# Patient Record
Sex: Male | Born: 1960 | Race: White | Hispanic: No | Marital: Married | Smoking: Never smoker
Health system: Southern US, Community
[De-identification: ages and names within clinical notes are randomized; demographics above are authoritative.]

## PROBLEM LIST (undated history)

## (undated) DIAGNOSIS — E785 Hyperlipidemia, unspecified: Secondary | ICD-10-CM

## (undated) DIAGNOSIS — K611 Rectal abscess: Secondary | ICD-10-CM

## (undated) DIAGNOSIS — K219 Gastro-esophageal reflux disease without esophagitis: Secondary | ICD-10-CM

## (undated) DIAGNOSIS — N200 Calculus of kidney: Secondary | ICD-10-CM

## (undated) DIAGNOSIS — M542 Cervicalgia: Secondary | ICD-10-CM

## (undated) DIAGNOSIS — J302 Other seasonal allergic rhinitis: Secondary | ICD-10-CM

## (undated) DIAGNOSIS — I10 Essential (primary) hypertension: Secondary | ICD-10-CM

## (undated) HISTORY — DX: Gastro-esophageal reflux disease without esophagitis: K21.9

## (undated) HISTORY — PX: NECK SURGERY: SHX720

## (undated) HISTORY — DX: Rectal abscess: K61.1

## (undated) HISTORY — PX: LUMBAR DISC SURGERY: SHX700

## (undated) HISTORY — PX: CERVICAL SPINE SURGERY: SHX589

## (undated) HISTORY — DX: Other seasonal allergic rhinitis: J30.2

## (undated) HISTORY — DX: Hyperlipidemia, unspecified: E78.5

## (undated) HISTORY — DX: Cervicalgia: M54.2

## (undated) HISTORY — PX: NASAL SEPTUM SURGERY: SHX37

## (undated) HISTORY — DX: Calculus of kidney: N20.0

## (undated) HISTORY — PX: BACK SURGERY: SHX140

## (undated) HISTORY — DX: Essential (primary) hypertension: I10

---

## 2002-01-25 ENCOUNTER — Encounter: Admission: RE | Admit: 2002-01-25 | Discharge: 2002-02-22 | Payer: Self-pay | Admitting: Unknown Physician Specialty

## 2008-11-12 ENCOUNTER — Ambulatory Visit: Admission: RE | Admit: 2008-11-12 | Discharge: 2008-11-12 | Payer: Self-pay | Admitting: Otolaryngology

## 2009-02-23 ENCOUNTER — Encounter (INDEPENDENT_AMBULATORY_CARE_PROVIDER_SITE_OTHER): Payer: Self-pay | Admitting: Otolaryngology

## 2009-02-23 ENCOUNTER — Ambulatory Visit (HOSPITAL_COMMUNITY): Admission: RE | Admit: 2009-02-23 | Discharge: 2009-02-23 | Payer: Self-pay | Admitting: Otolaryngology

## 2009-05-17 ENCOUNTER — Encounter: Admission: RE | Admit: 2009-05-17 | Discharge: 2009-05-17 | Payer: Self-pay | Admitting: Neurosurgery

## 2009-09-11 ENCOUNTER — Encounter: Admission: RE | Admit: 2009-09-11 | Discharge: 2009-09-11 | Payer: Self-pay | Admitting: Neurosurgery

## 2009-11-05 ENCOUNTER — Ambulatory Visit (HOSPITAL_COMMUNITY): Admission: RE | Admit: 2009-11-05 | Discharge: 2009-11-05 | Payer: Self-pay | Admitting: Neurosurgery

## 2009-12-19 ENCOUNTER — Encounter: Admission: RE | Admit: 2009-12-19 | Discharge: 2009-12-19 | Payer: Self-pay | Admitting: Neurosurgery

## 2011-03-12 LAB — DIFFERENTIAL
Basophils Relative: 0 % (ref 0–1)
Eosinophils Relative: 3 % (ref 0–5)
Lymphocytes Relative: 23 % (ref 12–46)
Monocytes Absolute: 0.5 10*3/uL (ref 0.1–1.0)
Monocytes Relative: 9 % (ref 3–12)
Neutro Abs: 3.4 10*3/uL (ref 1.7–7.7)
Neutrophils Relative %: 64 % (ref 43–77)

## 2011-03-12 LAB — TYPE AND SCREEN: Antibody Screen: NEGATIVE

## 2011-03-12 LAB — CBC
MCV: 86.1 fL (ref 78.0–100.0)
Platelets: 243 10*3/uL (ref 150–400)
RBC: 4.46 MIL/uL (ref 4.22–5.81)

## 2011-03-12 LAB — BASIC METABOLIC PANEL
BUN: 11 mg/dL (ref 6–23)
CO2: 31 mEq/L (ref 19–32)
Chloride: 103 mEq/L (ref 96–112)
GFR calc Af Amer: >60 mL/min (ref >60)
GFR calc non Af Amer: >60 mL/min (ref >60)
Glucose, Bld: 89 mg/dL (ref 70–99)
Sodium: 139 mEq/L (ref 135–145)

## 2011-03-20 LAB — CBC
HCT: 38.5 % — ABNORMAL LOW (ref 39.0–52.0)
Hemoglobin: 13.5 g/dL (ref 13.0–17.0)
MCHC: 35 g/dL (ref 30.0–36.0)
MCV: 86.1 fL (ref 78.0–100.0)
Platelets: 232 10*3/uL (ref 150–400)
RBC: 4.48 MIL/uL (ref 4.22–5.81)
RDW: 13.1 % (ref 11.5–15.5)

## 2011-03-25 DIAGNOSIS — J302 Other seasonal allergic rhinitis: Secondary | ICD-10-CM

## 2011-03-25 DIAGNOSIS — E785 Hyperlipidemia, unspecified: Secondary | ICD-10-CM

## 2011-04-22 NOTE — Procedures (Signed)
NAMESABIN, GIBEAULT                ACCOUNT NO.:  [id number]   MEDICAL RECORD NO.:  [id number]          PATIENT TYPE:  OUT   LOCATION:  SLEE                          FACILITY:  [facility]   PHYSICIAN:  Kofi A. Gerilyn Pilgrim, M.D. DATE OF BIRTH:  05-11-1961   DATE OF PROCEDURE:  11/12/2008  DATE OF DISCHARGE:  11/12/2008                             SLEEP DISORDER REPORT   REFERRING PHYSICIAN:  Onalee Hua L. Annalee Genta, M.D.   HISTORY:  This is a 50 year old man who presents with fatigue and  snoring. He has been evaluated for obstructive sleep apnea syndrome.   MEDICATIONS:  1. Benadryl.  2. Simvastatin.  3. Flonase.   EPWORTH SLEEPINESS SCALE:  4.   BODY MASS INDEX:  29.   ARCHITECTURAL SUMMARY:  The total recording time is 443 minutes. Sleep  efficiency 80%. Sleep latency 19 minutes. REM latency 159 minutes. Stage  N1 10%, N2 53%, N3 21%, and REM sleep 14%.   RESPIRATORY SUMMARY:  The baseline oxygen saturation is 98%. Lower  saturation is 90%. AHI 1.   LIMB MOVEMENT SUMMARY:  PLM index 0.   ELECTROCARDIOGRAM SUMMARY:  Average heart rate 65 with no significant  dysrhythmias observed.   IMPRESSION:  Unremarkable nocturnal polysomnography.      Kofi A. Gerilyn Pilgrim, M.D.  Electronically Signed     KAD/MEDQ  D:  11/24/2008  T:  11/24/2008  Job:  [id number]

## 2011-04-22 NOTE — Op Note (Signed)
Earl Martinez, Earl Martinez                ACCOUNT NO.:  [id number]   MEDICAL RECORD NO.:  [id number]          PATIENT TYPE:  AMB   LOCATION:  SDS                          FACILITY:  [facility]   PHYSICIAN:  Onalee Hua L. Annalee Genta, M.D.DATE OF BIRTH:  [birth year]   DATE OF PROCEDURE:  DATE OF DISCHARGE:  02/23/2009                               OPERATIVE REPORT   PREOPERATIVE DIAGNOSES:  1. Deviated nasal septum with nasal airway obstruction.  2. Inferior turbinate hypertrophy.  3. Uvular hypertrophy.   POSTOPERATIVE DIAGNOSES:  1. Deviated nasal septum with nasal airway obstruction.  2. Inferior turbinate hypertrophy.  3. Uvular hypertrophy.   INDICATIONS FOR SURGERY:  1. Deviated nasal septum with nasal airway obstruction.  2. Inferior turbinate hypertrophy.  3. Uvular hypertrophy.   SURGICAL PROCEDURES:  1. Nasal septoplasty.  2. Bilateral inferior turbinate reduction.  3. Uvulectomy.   ANESTHESIA:  General endotracheal.   SURGEON:  Kinnie Scales. Annalee Genta, MD   COMPLICATIONS:  None.   BLOOD LOSS:  Less than 50 mL.   The patient transferred from the operating room to the recovery room in  stable condition.   BRIEF HISTORY:  The patient is a 50 year old white male who was referred  for evaluation of progressive symptoms of nasal airway obstruction,  nasal congestion, and possible sleep apnea.  Evaluation in the office  revealed a severely deviated septum with left septal spurring and near  complete airway obstruction, bilateral turbinate hypertrophy, and  significant thickening of the palate, uvula, and posterior tonsillar  pillars.  Given the patient's history, a sleep study was performed which  showed no evidence of obstructive sleep apnea.  The patient had mild  sleep disordered breathing, but no airway desaturations.  Given his  history and physical examination, we discussed various treatment  options.  After he had failed to respond to appropriate medical therapy,  we scheduled  him for nasal septoplasty, inferior turbinate reduction,  and uvulectomy.  The risks, benefits, and possible complications of each  of these surgical procedures were discussed in detail with the patient  who understood and concurred with our plan for surgery which is  scheduled as an outpatient under general anesthesia at [facility] OR.   SURGICAL PROCEDURE:  The patient was brought to the operating room on  February 23, 2009, and placed in supine position on the operating table.  General endotracheal anesthesia was established without difficulty.  When the patient was adequately anesthetized, his nose was injected with  a total of 9 mL of 1% lidocaine 1:100,000 dilution epinephrine which was  injected in the submucosal fashion along the nasal septum and inferior  turbinates bilaterally.  The patient's nose was then packed with Afrin-  soaked cottonoid pledgets which were left in place for approximately 10  minutes to allow for vasoconstriction and hemostasis.  The patient was  positioned and prepped and draped in a sterile fashion.  Surgical  procedure was begun by creating a right anterior hemitransfixion  incision.  This was carried through the mucosa and underlying submucosa,  and a mucoperichondrial flap was elevated from anterior  to posterior  along the patient's right-hand side.  The bony cartilaginous junction  was crossed at the midline, and the mucoperiosteal flap was elevated on  the left.  There was a large bony and cartilaginous septal spur along  the entire left nasal passageway which was mobilized.  Overlying mucosa  was preserved, and the deviated bone and cartilage were resected with a  4-mm osteotome.  Mid septal cartilage was removed.  Anterior dorsal and  columellar cartilage were not deviated and these were left intact.  Posterior bony deviation was resected with through cutting forceps.  The  mid septal cartilage was morselized and then returned to the   mucoperichondrial pocket.  The hemitransfixion incision was closed with  a 4-0 gut suture on a Keith needle, and the flaps were reapproximated  with the same stitch in a horizontal mattress in the fashion.  At the  conclusion of this portion of the procedure, nasal septal splints were  then placed after the application of Bactroban ointment and were sutured  in position with a 3-0 Ethilon suture.   Inferior turbinate reduction was then begun with bipolar cautery set at  12 watts.  Two submucosal passes were made in each inferior turbinate.  When the turbinates have been adequately cauterized, small incisions  were created in each inferior turbinate.  Overlying mucosa was elevated.  A small amount of turbinate bone was resected.  Turbinates were then  outfractured to create a more patent nasal cavity.  Nasal cavity and  nasopharynx were irrigated and suctioned.   Attention was then turned to the oral cavity and oropharynx where  uvulectomy was performed.  A Crowe-Davis mouthgag was inserted without  difficulty.  The palatal margin was then gently palpated and Bovie  electrocautery was used to create an incision along the anterior aspect  of the soft palate.  This resection included mucosa and muscular layer,  preserving the posterior mucosa for closure.  A U-shaped incision was  created along the entire palatal margin resecting an approximately one-  half centimeter cm strip of tissue including the entire uvula which was  sent to Pathology.  Posterior palatal mucosa was then advanced  anteriorly and sutured using 3-0 Vicryl suture in an interrupted  fashion.  The posterolateral aspects were advanced in order to create a  more patent nasopharynx.  There was no bleeding.  An orogastric tube was  passed.  Stomach contents were aspirated.  The patient was then awakened  from his anesthetic, he was extubated and was transferred from the  operating room to the recovery room in stable condition.   There were no  complications, and blood loss was less than 50 mL.           ______________________________  Kinnie Scales. Annalee Genta, M.D.     DLS/MEDQ  D:  16/09/9603  T:  02/23/2009  Job:  [id number]

## 2011-09-18 ENCOUNTER — Encounter (INDEPENDENT_AMBULATORY_CARE_PROVIDER_SITE_OTHER): Payer: Self-pay | Admitting: General Surgery

## 2011-09-18 ENCOUNTER — Ambulatory Visit (INDEPENDENT_AMBULATORY_CARE_PROVIDER_SITE_OTHER): Payer: 59 | Admitting: General Surgery

## 2011-09-18 VITALS — BP 119/76 | HR 71 | Temp 97.1°F | Resp 14 | Ht 71.0 in | Wt 202.6 lb

## 2011-09-18 DIAGNOSIS — K611 Rectal abscess: Secondary | ICD-10-CM

## 2011-09-18 DIAGNOSIS — K612 Anorectal abscess: Secondary | ICD-10-CM

## 2011-09-18 NOTE — Progress Notes (Signed)
Chief Complaint  Patient presents with  . Rectal Pain    hemorrhorid    HPI Earl Martinez is a 50 y.o. male. This patient is referred for evaluation of 3 day history of increasing rectal pain. He was seen at his primary care physician's office and was given a donut, pain medication, and an antibiotic and it was felt that this was due to hemorrhoid tissue. He was seen back yesterday for evaluation and states that his symptoms have continued to increase. He has not had any relief with sitz baths in fact his symptoms increased after the sitz bath. He's had some chills but no fevers and no redness or drainage from the area he states that his bowels are usually normal approximately once daily although he does have large stools. He was recently on a cruise and he had severe diarrhea which was treated with Imodium and then was turned to constipation. He had an enema last night for relief of his constipation. He is no history of a prior bulge in the area and no history of hemorrhoids. He has had some burning and itching in the area for which he has used steroid creams but did not provide any relief with this. He takes fiber daily but occasionally has some rare, bright red blood on the tissue after wiping. He has no family history of colon cancer denies any weight loss and no history of a prior colonoscopy. HPI  Past Medical History  Diagnosis Date  . Hyperlipemia   . Seasonal allergies   . Neck pain     History reviewed. No pertinent past surgical history.   History reviewed. No pertinent family history.  Social History History  Substance Use Topics  . Smoking status: Never Smoker   . Smokeless tobacco: Not on file  . Alcohol Use: No    No Known Allergies  Current Outpatient Prescriptions  Medication Sig Dispense Refill  . atorvastatin (LIPITOR) 40 MG tablet       . HYDROcodone-acetaminophen (NORCO) 7.5-325 MG per tablet       . hydrocortisone (ANUSOL-HC) 25 MG suppository       .  simvastatin (ZOCOR) 40 MG tablet Take 40 mg by mouth at bedtime.        . sulfamethoxazole-trimethoprim (BACTRIM DS) 800-160 MG per tablet         Review of Systems Review of Systems  HENT: Positive for congestion.   Gastrointestinal: Positive for diarrhea, constipation, blood in stool and rectal pain.  Genitourinary: Positive for difficulty urinating.  Skin: Positive for wound.  All other systems reviewed and are negative.    Blood pressure 119/76, pulse 71, temperature 97.1 F (36.2 C), temperature source Temporal, resp. rate 14, height 5\' 11"  (1.803 m), weight 202 lb 9.6 oz (91.899 kg).  Physical Exam Physical Exam  Constitutional: He appears well-developed and well-nourished. No distress.  HENT:  Head: Normocephalic and atraumatic.  Eyes: Conjunctivae are normal. Pupils are equal, round, and reactive to light. Right eye exhibits no discharge. Left eye exhibits no discharge. No scleral icterus.  Neck: Normal range of motion. No tracheal deviation present.  Cardiovascular: Normal rate, regular rhythm and normal heart sounds.   Pulmonary/Chest: Effort normal and breath sounds normal. No stridor. No respiratory distress. He has no wheezes.  Abdominal: Soft. Bowel sounds are normal. He exhibits mass. He exhibits no distension. There is no tenderness. There is no rebound and no guarding.  Genitourinary:       He has a tender indurated  mass to the right of his anal region in the soft tissue of the buttocks. There is no evidence of drainage or cellulitis. I do not appreciate any hemorrhoidal tissue. Anoscopic exam did not reveal any internal hemorrhoids. There is no evidence of fissure. That's had ultrasound of the area shows a 3 cm area of complex fluid collection consistent with a likely peri rectal abscess  Skin: He is not diaphoretic.    Data Reviewed   Assessment    Perirectal pain and tenderness which is likely due to a perirectal abscess. He does have some induration to the  right of his anal area in the area of his greatest tenderness an ultrasound performed at the bedside demonstrates a complex fluid collection in this area as well. I do not see any abdominal tissue and I think that this is most likely a perirectal abscess which will require a formal exam under anesthesia and drainage.    Plan    Given the fact that the patient has recently eaten I have discussed this case with Dr. Zachery Dakins who will examine him again in the morning and if he agrees that this is a perirectal abscess he will perform formal exam under anesthesia and drainage. I recommended to the patient that he continue his Bactrim and not eat anything after midnight in preparation for possible surgical drainage in the morning.       [name] 09/18/2011, 4:38 PM

## 2011-09-19 ENCOUNTER — Ambulatory Visit (INDEPENDENT_AMBULATORY_CARE_PROVIDER_SITE_OTHER): Payer: Self-pay | Admitting: Surgery

## 2011-09-19 ENCOUNTER — Ambulatory Visit (HOSPITAL_COMMUNITY)
Admission: RE | Admit: 2011-09-19 | Discharge: 2011-09-19 | Disposition: A | Discharge status: Home / Self Care | Payer: 59 | Source: Ambulatory Visit | Attending: General Surgery | Admitting: General Surgery

## 2011-09-19 DIAGNOSIS — K612 Anorectal abscess: Secondary | ICD-10-CM | POA: Insufficient documentation

## 2011-09-19 DIAGNOSIS — Z79899 Other long term (current) drug therapy: Secondary | ICD-10-CM | POA: Insufficient documentation

## 2011-09-19 DIAGNOSIS — K644 Residual hemorrhoidal skin tags: Secondary | ICD-10-CM | POA: Insufficient documentation

## 2011-09-19 HISTORY — PX: RECTAL SURGERY: SHX760

## 2011-09-19 LAB — BASIC METABOLIC PANEL
BUN: 12 mg/dL (ref 6–23)
CO2: 28 mEq/L (ref 19–32)
Chloride: 100 mEq/L (ref 96–112)
GFR calc Af Amer: >90 mL/min (ref >90)
GFR calc non Af Amer: >90 mL/min (ref >90)
Glucose, Bld: 98 mg/dL (ref 70–99)
Potassium: 4.7 mEq/L (ref 3.5–5.1)
Sodium: 135 mEq/L (ref 135–145)

## 2011-09-19 LAB — SURGICAL PCR SCREEN: Staphylococcus aureus: NEGATIVE

## 2011-09-19 LAB — CBC
MCH: 28.3 pg (ref 26.0–34.0)
MCHC: 33.3 g/dL (ref 30.0–36.0)

## 2011-09-22 LAB — CULTURE, ROUTINE-ABSCESS: Culture: NO GROWTH

## 2011-09-23 ENCOUNTER — Encounter (INDEPENDENT_AMBULATORY_CARE_PROVIDER_SITE_OTHER): Payer: Self-pay | Admitting: General Surgery

## 2011-09-23 ENCOUNTER — Ambulatory Visit (INDEPENDENT_AMBULATORY_CARE_PROVIDER_SITE_OTHER): Payer: 59 | Admitting: General Surgery

## 2011-09-23 VITALS — BP 128/86 | HR 64 | Temp 97.9°F | Resp 16 | Ht 71.0 in | Wt 203.4 lb

## 2011-09-23 DIAGNOSIS — K612 Anorectal abscess: Secondary | ICD-10-CM

## 2011-09-23 DIAGNOSIS — K611 Rectal abscess: Secondary | ICD-10-CM

## 2011-09-23 NOTE — Progress Notes (Signed)
Subjective:     Patient ID: Earl Martinez, male   DOB: 01-Apr-1961, 50 y.o.   MRN: [id number]  HPIPatient returns now 4 days following drainage of a large perirectal abscess that originated on the right side and I think actually originated about 9:00 position but the abscess went anterior to the eye and a well as a portion towards the posterior I could not find a definite internal fistulogram on careful examination in the operating room and the cultures are showing no growth even that it was a large amount of frank pus at the time of surgery. The patient had not had any previous problems with perirectal abscess but had been on a cruise and the first diarrhea and then constipation. The patient is a pleasant man and is off work this week but a little bit of return to work on Monday  Review of Systems  Current Outpatient Prescriptions  Medication Sig Dispense Refill  . amoxicillin (AMOXIL) 500 MG capsule 4 times daily.      Marland Kitchen atorvastatin (LIPITOR) 40 MG tablet       . Docusate Calcium (STOOL SOFTENER PO) Take by mouth 2 (two) times daily.        Marland Kitchen HYDROcodone-acetaminophen (VICODIN) 5-500 MG per tablet Ad lib.      . Polyethylene Glycol 3350 (MIRALAX PO) Take by mouth as needed.             Objective:   Physical ExamBP 128/86  Pulse 64  Temp(Src) 97.9 F (36.6 C) (Temporal)  Resp 16  Ht 5\' 11"  (1.803 m)  Wt 203 lb 6.4 oz (92.262 kg)  BMI 28.37 kg/m2 The patient's drainage which was done through an incision at approximately 9:00 position is clean I don't appreciate any collection on rectal examination or anything that feels like a fistula and he had a little bit of an irritated hemorrhoid posterior that actually put off chromic suture. He'll continue with his smoking complete the amoxicillin and let me see him in followup in Friday week      Assessment:       Large perirectal abscess but no definite fistula in ano  identified so far Plan:     Continue the child soaking completely  amoxicillin and return to see me in approximately 10 days.

## 2011-09-23 NOTE — Patient Instructions (Signed)
Continued very tub soaks 3 times each day and watch with a washcloth. He is to shower its not as effective cleansing of the area and I would soak first and then shower. Complete the amoxicillin and thank you could return to work next Monday just a MiraLax suture prevented a hard bowel movement but she don't want to give himself diarrhea has that causes more pain.

## 2011-09-24 NOTE — Op Note (Signed)
NAMEJAXX, Earl Martinez                ACCOUNT NO.:  [id number]  MEDICAL RECORD NO.:  [id number]  LOCATION:  DAYL                         FACILITY:  [facility]  PHYSICIAN:  Anselm Pancoast. Renise Gillies, M.D.DATE OF BIRTH:  1961/07/24  DATE OF PROCEDURE:  09/19/2011 DATE OF DISCHARGE:  09/19/2011                              OPERATIVE REPORT   PREOPERATIVE DIAGNOSIS:  Perirectal abscess, predominantly right lateral, may be anterior.  POSTOPERATIVE DIAGNOSIS:  Large perirectal abscess.  I am not sure whether it originated posterior or probably in the anterior area.  OPERATION:  Drainage of large perirectal abscess, and culture and packing.  ANESTHESIA:  General anesthesia in lithotomy position.  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 Biagio Quint with approximately 2-day history of increasing pain and swelling around his anus.  He had seen his regular physician earlier in the week and thought to have problems with hemorrhoids and had been placed on Septra with no improvement, was called and was seen in the urgent office yesterday.  Dr. Biagio Quint felt that the abscess was probably large enough that OR drainage would be preferential to a drain it in the office.  The patient had eaten a hamburger, so he called me in to see if I could add him to the OR schedule for first thing this morning.  The patient has been n.p.o. after midnight and I called him and arrangements were made to bring him to the Same Day as an outpatient procedure.  His laboratory studies are unremarkable.  Glucose was 98.  His white count is mildly elevated at 11,000 and his hematocrit is 40.5.  The patient has been on a cruise recently about 10 days ago, had problems with diarrhea that occurred, then he had a problem with constipation,  both of that is related to causing the perirectal abscess is in question.  The patient preoperatively, I examined him and he  definitely has a big fluctuant area.  __________ about 9 o'clock if you are looking at him in the typical lithotomy position, predominantly on the right side with a definite bulge.  The area does have some irritated hemorrhoids posteriorly and the abscess could possibly had been drained in the office, but I would go ahead and take him to the operating room, which the patient preferred.  I discussed with him preoperatively that we would be looking to see if we could see the origin of this as most likely this started from a infected crypt, possibly related to diarrhea or the constipation that occurred on the cruise.  The patient, induction of general anesthesia, we used LMA tube.  We then put him up in the [place] stirrups.  I prepped his perianal area and anus with Betadine scrub and solution.  After draping him sterilely with the buttocks __________ towels and leggings, I first used an anoscope above it and looked very carefully.  The abscess is really __________ at approximately the 9:30 position and I elected to make the incision probably about 9:30 position, but on looking, I could see one little area that looks like a little infected crypt that is  more towards the posterior.  I made a little incision in the abscess cavity.  It was down about a good cm or possibly two.  I then broke up the loculations and cultured it aerobically.  I then used a hemostat and kind of carefully inspected.  I could not see anything directly __________ that would look like an infected crypt and I did not do anything except draining the abscess.  After we had carefully irrigated and aspirated, etc., I did put a few sutures of 3-0 chromic in the kind of generous veins in this perirectal external hemorrhoid area and then used about a half of pack and sponge that had been soaked with Betadine and placed into the little abscess cavity.  There was a little hemorrhoid posteriorly that was irritated and I did  put a figure-of-eight of 3-0 chromic and I think that has been related to the irritation from spasm etc.  I am going to release the patient after a short stay in the recovery room.  I do want him to be on amoxicillin 500 mg q.i.d., await the results of the culture and let me see him in the office next week.  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 The patient tolerated the procedure nicely and hopefully will feel much better in approximately 24 hours.     Anselm Pancoast. Zachery Dakins, M.D.     WJW/MEDQ  D:  09/19/2011  T:  09/19/2011  Job:  [id number]  Electronically Signed by Consuello Bossier M.D. on 09/24/2011 08:55:07 AM

## 2011-10-03 ENCOUNTER — Encounter (INDEPENDENT_AMBULATORY_CARE_PROVIDER_SITE_OTHER): Payer: 59 | Admitting: General Surgery

## 2011-10-10 ENCOUNTER — Ambulatory Visit (INDEPENDENT_AMBULATORY_CARE_PROVIDER_SITE_OTHER): Payer: 59 | Admitting: General Surgery

## 2011-10-10 ENCOUNTER — Encounter (INDEPENDENT_AMBULATORY_CARE_PROVIDER_SITE_OTHER): Payer: Self-pay | Admitting: General Surgery

## 2011-10-10 VITALS — BP 142/98 | HR 64 | Temp 98.0°F | Resp 20 | Ht 71.0 in | Wt 201.2 lb

## 2011-10-10 DIAGNOSIS — K612 Anorectal abscess: Secondary | ICD-10-CM

## 2011-10-10 DIAGNOSIS — K611 Rectal abscess: Secondary | ICD-10-CM

## 2011-10-10 NOTE — Progress Notes (Signed)
Subjective:     Patient ID: Earl Martinez, male   DOB: 1961-02-27, 50 y.o.   MRN: [id number]  HPIPatient is returned to work and he is now approximately 3 weeks after drainage of a large pararectal abscess noted in the in the operating room and I could not find any communication to the dentate line in the anterior or posterior the abscess was right at 9:00 so its and I did use of hemostat and protein etc. but could not identify the origin. On exam today the cavity is closed his chest is very minute area of granulation tissue that I used a little silver nitrate own and the patient is well by her that if he would have any recurrent infection type symptoms that he needs to be reexamined in her office if he is having no problems he can see [place] on a p.r.n. basis and if there is any question of positive abnormality on happy to reexamine one additional time in approximately 3 weeks   Review of Systems     Objective:   Physical ExamBP 142/98  Pulse 64  Temp(Src) 98 F (36.7 C) (Temporal)  Resp 20  Ht 5\' 11"  (1.803 m)  Wt 201 lb 4 oz (91.286 kg)  BMI 28.07 kg/m2    Anoscopic exam reveals no evidence of any fistula and a node in the low cavity where the abscess was drained barely noticeable just a minute amount of granulation tissue I cannot get approach because of any place and the patient will see [place] on a p.r.n. basis if there's any problem Assessment:    Return p.r.n. if there is any symptoms of peri-anal pain or drainage. If he were to have a recurrent abscess he needs to be seen in urgent office on prognosis and not just treated with antibiotics. No evidence of any active infection or fistula in ano on exam at this time     Plan:    see [place] prn

## 2013-03-08 ENCOUNTER — Other Ambulatory Visit: Payer: Self-pay

## 2013-03-08 MED ORDER — ATORVASTATIN CALCIUM 40 MG PO TABS: 40.0000 mg | ORAL_TABLET | Freq: Every day | ORAL | Status: DC

## 2013-04-07 ENCOUNTER — Other Ambulatory Visit: Payer: Self-pay | Admitting: Nurse Practitioner

## 2013-05-09 ENCOUNTER — Other Ambulatory Visit: Payer: Self-pay | Admitting: Nurse Practitioner

## 2013-05-10 NOTE — Telephone Encounter (Signed)
Last lipids 10/13

## 2013-06-03 ENCOUNTER — Other Ambulatory Visit: Payer: Self-pay | Admitting: Nurse Practitioner

## 2013-06-14 ENCOUNTER — Encounter: Payer: Self-pay | Admitting: Family Medicine

## 2013-06-14 ENCOUNTER — Ambulatory Visit (INDEPENDENT_AMBULATORY_CARE_PROVIDER_SITE_OTHER): Payer: 59 | Admitting: Family Medicine

## 2013-06-14 VITALS — BP 134/83 | HR 57 | Temp 96.9°F | Wt 203.0 lb

## 2013-06-14 DIAGNOSIS — E785 Hyperlipidemia, unspecified: Secondary | ICD-10-CM

## 2013-06-14 DIAGNOSIS — Z0000 Encounter for general adult medical examination without abnormal findings: Secondary | ICD-10-CM

## 2013-06-14 LAB — COMPLETE METABOLIC PANEL WITH GFR
ALT: 23 U/L (ref 0–53)
AST: 21 U/L (ref 0–37)
Albumin: 4.5 g/dL (ref 3.5–5.2)
Alkaline Phosphatase: 61 U/L (ref 39–117)
BUN: 21 mg/dL (ref 6–23)
CO2: 28 mEq/L (ref 19–32)
Calcium: 9.8 mg/dL (ref 8.4–10.5)
Chloride: 104 mEq/L (ref 96–112)
Creat: 0.99 mg/dL (ref 0.50–1.35)
GFR, Est African American: >89 mL/min
GFR, Est Non African American: 88 mL/min
Glucose, Bld: 94 mg/dL (ref 70–99)
Potassium: 4.9 mEq/L (ref 3.5–5.3)
Sodium: 140 mEq/L (ref 135–145)
Total Bilirubin: 0.6 mg/dL (ref 0.3–1.2)
Total Protein: 7 g/dL (ref 6.0–8.3)

## 2013-06-14 LAB — POCT CBC
Granulocyte percent: 68.1 %G (ref 37–80)
HCT, POC: 41.9 % — AB (ref 43.5–53.7)
Hemoglobin: 14.8 g/dL (ref 14.1–18.1)
Lymph, poc: 1.9 (ref 0.6–3.4)
MCH, POC: 30.8 pg (ref 27–31.2)
MCHC: 35.3 g/dL (ref 31.8–35.4)
MCV: 87.2 fL (ref 80–97)
MPV: 7.9 fL (ref 0–99.8)
POC Granulocyte: 4.4 (ref 2–6.9)
POC LYMPH PERCENT: 29.5 %L (ref 10–50)
Platelet Count, POC: 183 10*3/uL (ref 142–424)
RBC: 4.8 M/uL (ref 4.69–6.13)
RDW, POC: 12.5 %
WBC: 6.4 10*3/uL (ref 4.6–10.2)

## 2013-06-14 LAB — LIPID PANEL
Cholesterol: 127 mg/dL (ref 0–200)
HDL: 38 mg/dL — ABNORMAL LOW (ref >39)
LDL Cholesterol: 72 mg/dL (ref 0–99)
Total CHOL/HDL Ratio: 3.3 Ratio
Triglycerides: 86 mg/dL (ref <150)
VLDL: 17 mg/dL (ref 0–40)

## 2013-06-14 LAB — TSH: TSH: 2.574 u[IU]/mL (ref 0.350–4.500)

## 2013-06-14 LAB — PSA: PSA: 1.14 ng/mL (ref <=4.00)

## 2013-06-14 MED ORDER — ATORVASTATIN CALCIUM 40 MG PO TABS: 40.0000 mg | ORAL_TABLET | Freq: Every day | ORAL | Status: DC

## 2013-06-14 NOTE — Progress Notes (Signed)
  Subjective:    Patient ID: Earl Martinez, male    DOB: October 20, 1961, 52 y.o.   MRN: [id number]  HPI This 52 y.o. male presents for evaluation of hyperlipidemia.  He has been taking atorvastatin 40mg  po qd and needs refills.  He is due for CPE and labs.  He does not report any acute illness or medical problems.  He has not had a colonoscopy..   Review of Systems No chest pain, SOB, HA, dizziness, vision change, N/V, diarrhea, constipation, dysuria, urinary urgency or frequency, myalgias, arthralgias or rash.     Objective:   Physical Exam  Vital signs noted  Well developed well nourished male.  HEENT - Head atraumatic Normocephalic                Eyes - PERRLA, Conjuctiva - clear Sclera- Clear EOMI                Ears - EAC's Wnl TM's Wnl Gross Hearing WNL                Nose - Nares patent                 Throat - oropharanx wnl Respiratory - Lungs CTA bilateral Cardiac - RRR S1 and S2 without murmur GI - Abdomen soft Nontender and bowel sounds active x 4 Extremities - No edema. Neuro - Grossly intact.      Assessment & Plan:  Other and unspecified hyperlipidemia - Plan: atorvastatin (LIPITOR) 40 MG tablet, Lipid panel, Continue fish oil tablets.  Follow up in 6 months.  Routine general medical examination at a health care facility - Plan: POCT CBC, Lipid panel, TSH, COMPLETE METABOLIC PANEL WITH GFR, PSA.  Discussed getting colonoscopy and he wants to wait.  Follow up in 6 months.

## 2013-06-14 NOTE — Patient Instructions (Signed)
Hypertriglyceridemia  Diet for High blood levels of Triglycerides Most fats in food are triglycerides. Triglycerides in your blood are stored as fat in your body. High levels of triglycerides in your blood may put you at a greater risk for heart disease and stroke.  Normal triglyceride levels are less than 150 mg/dL. Borderline high levels are 150-199 mg/dl. High levels are 200 - 499 mg/dL, and very high triglyceride levels are greater than 500 mg/dL. The decision to treat high triglycerides is generally based on the level. For people with borderline or high triglyceride levels, treatment includes weight loss and exercise. Drugs are recommended for people with very high triglyceride levels. Many people who need treatment for high triglyceride levels have metabolic syndrome. This syndrome is a collection of disorders that often include: insulin resistance, high blood pressure, blood clotting problems, high cholesterol and triglycerides. TESTING PROCEDURE FOR TRIGLYCERIDES  You should not eat 4 hours before getting your triglycerides measured. The normal range of triglycerides is between 10 and 250 milligrams per deciliter (mg/dl). Some people may have extreme levels (1000 or above), but your triglyceride level may be too high if it is above 150 mg/dl, depending on what other risk factors you have for heart disease.  People with high blood triglycerides may also have high blood cholesterol levels. If you have high blood cholesterol as well as high blood triglycerides, your risk for heart disease is probably greater than if you only had high triglycerides. High blood cholesterol is one of the main risk factors for heart disease. CHANGING YOUR DIET  Your weight can affect your blood triglyceride level. If you are more than 20% above your ideal body weight, you may be able to lower your blood triglycerides by losing weight. Eating less and exercising regularly is the best way to combat this. Fat provides more  calories than any other food. The best way to lose weight is to eat less fat. Only 30% of your total calories should come from fat. Less than 7% of your diet should come from saturated fat. A diet low in fat and saturated fat is the same as a diet to decrease blood cholesterol. By eating a diet lower in fat, you may lose weight, lower your blood cholesterol, and lower your blood triglyceride level.  Eating a diet low in fat, especially saturated fat, may also help you lower your blood triglyceride level. Ask your dietitian to help you figure how much fat you can eat based on the number of calories your caregiver has prescribed for you.  Exercise, in addition to helping with weight loss may also help lower triglyceride levels.   Alcohol can increase blood triglycerides. You may need to stop drinking alcoholic beverages.  Too much carbohydrate in your diet may also increase your blood triglycerides. Some complex carbohydrates are necessary in your diet. These may include bread, rice, potatoes, other starchy vegetables and cereals.  Reduce "simple" carbohydrates. These may include pure sugars, candy, honey, and jelly without losing other nutrients. If you have the kind of high blood triglycerides that is affected by the amount of carbohydrates in your diet, you will need to eat less sugar and less high-sugar foods. Your caregiver can help you with this.  Adding 2-4 grams of fish oil (EPA+ DHA) may also help lower triglycerides. Speak with your caregiver before adding any supplements to your regimen. Following the Diet  Maintain your ideal weight. Your caregivers can help you with a diet. Generally, eating less food and getting more   exercise will help you lose weight. Joining a weight control group may also help. Ask your caregivers for a good weight control group in your area.  Eat low-fat foods instead of high-fat foods. This can help you lose weight too.  These foods are lower in fat. Eat MORE of these:    Dried beans, peas, and lentils.  Egg whites.  Low-fat cottage cheese.  Fish.  Lean cuts of meat, such as round, sirloin, rump, and flank (cut extra fat off meat you fix).  Whole grain breads, cereals and pasta.  Skim and nonfat dry milk.  Low-fat yogurt.  Poultry without the skin.  Cheese made with skim or part-skim milk, such as mozzarella, parmesan, farmers', ricotta, or pot cheese. These are higher fat foods. Eat LESS of these:   Whole milk and foods made from whole milk, such as American, blue, cheddar, monterey jack, and swiss cheese  High-fat meats, such as luncheon meats, sausages, knockwurst, bratwurst, hot dogs, ribs, corned beef, ground pork, and regular ground beef.  Fried foods. Limit saturated fats in your diet. Substituting unsaturated fat for saturated fat may decrease your blood triglyceride level. You will need to read package labels to know which products contain saturated fats.  These foods are high in saturated fat. Eat LESS of these:   Fried pork skins.  Whole milk.  Skin and fat from poultry.  Palm oil.  Butter.  Shortening.  Cream cheese.  Bacon.  Margarines and baked goods made from listed oils.  Vegetable shortenings.  Chitterlings.  Fat from meats.  Coconut oil.  Palm kernel oil.  Lard.  Cream.  Sour cream.  Fatback.  Coffee whiteners and non-dairy creamers made with these oils.  Cheese made from whole milk. Use unsaturated fats (both polyunsaturated and monounsaturated) moderately. Remember, even though unsaturated fats are better than saturated fats; you still want a diet low in total fat.  These foods are high in unsaturated fat:   Canola oil.  Sunflower oil.  Mayonnaise.  Almonds.  Peanuts.  Pine nuts.  Margarines made with these oils.  Safflower oil.  Olive oil.  Avocados.  Cashews.  Peanut butter.  Sunflower seeds.  Soybean oil.  Peanut  oil.  Olives.  Pecans.  Walnuts.  Pumpkin seeds. Avoid sugar and other high-sugar foods. This will decrease carbohydrates without decreasing other nutrients. Sugar in your food goes rapidly to your blood. When there is excess sugar in your blood, your liver may use it to make more triglycerides. Sugar also contains calories without other important nutrients.  Eat LESS of these:   Sugar, brown sugar, powdered sugar, jam, jelly, preserves, honey, syrup, molasses, pies, candy, cakes, cookies, frosting, pastries, colas, soft drinks, punches, fruit drinks, and regular gelatin.  Avoid alcohol. Alcohol, even more than sugar, may increase blood triglycerides. In addition, alcohol is high in calories and low in nutrients. Ask for sparkling water, or a diet soft drink instead of an alcoholic beverage. Suggestions for planning and preparing meals   Bake, broil, grill or roast meats instead of frying.  Remove fat from meats and skin from poultry before cooking.  Add spices, herbs, lemon juice or vinegar to vegetables instead of salt, rich sauces or gravies.  Use a non-stick skillet without fat or use no-stick sprays.  Cool and refrigerate stews and broth. Then remove the hardened fat floating on the surface before serving.  Refrigerate meat drippings and skim off fat to make low-fat gravies.  Serve more fish.  Use less butter,   margarine and other high-fat spreads on bread or vegetables.  Use skim or reconstituted non-fat dry milk for cooking.  Cook with low-fat cheeses.  Substitute low-fat yogurt or cottage cheese for all or part of the sour cream in recipes for sauces, dips or congealed salads.  Use half yogurt/half mayonnaise in salad recipes.  Substitute evaporated skim milk for cream. Evaporated skim milk or reconstituted non-fat dry milk can be whipped and substituted for whipped cream in certain recipes.  Choose fresh fruits for dessert instead of high-fat foods such as pies or  cakes. Fruits are naturally low in fat. When Dining Out   Order low-fat appetizers such as fruit or vegetable juice, pasta with vegetables or tomato sauce.  Select clear, rather than cream soups.  Ask that dressings and gravies be served on the side. Then use less of them.  Order foods that are baked, broiled, poached, steamed, stir-fried, or roasted.  Ask for margarine instead of butter, and use only a small amount.  Drink sparkling water, unsweetened tea or coffee, or diet soft drinks instead of alcohol or other sweet beverages. QUESTIONS AND ANSWERS ABOUT OTHER FATS IN THE BLOOD: SATURATED FAT, TRANS FAT, AND CHOLESTEROL What is trans fat? Trans fat is a type of fat that is formed when vegetable oil is hardened through a process called hydrogenation. This process helps makes foods more solid, gives them shape, and prolongs their shelf life. Trans fats are also called hydrogenated or partially hydrogenated oils.  What do saturated fat, trans fat, and cholesterol in foods have to do with heart disease? Saturated fat, trans fat, and cholesterol in the diet all raise the level of LDL "bad" cholesterol in the blood. The higher the LDL cholesterol, the greater the risk for coronary heart disease (CHD). Saturated fat and trans fat raise LDL similarly.  What foods contain saturated fat, trans fat, and cholesterol? High amounts of saturated fat are found in animal products, such as fatty cuts of meat, chicken skin, and full-fat dairy products like butter, whole milk, cream, and cheese, and in tropical vegetable oils such as palm, palm kernel, and coconut oil. Trans fat is found in some of the same foods as saturated fat, such as vegetable shortening, some margarines (especially hard or stick margarine), crackers, cookies, baked goods, fried foods, salad dressings, and other processed foods made with partially hydrogenated vegetable oils. Small amounts of trans fat also occur naturally in some animal  products, such as milk products, beef, and lamb. Foods high in cholesterol include liver, other organ meats, egg yolks, shrimp, and full-fat dairy products. How can I use the new food label to make heart-healthy food choices? Check the Nutrition Facts panel of the food label. Choose foods lower in saturated fat, trans fat, and cholesterol. For saturated fat and cholesterol, you can also use the Percent Daily Value (%DV): 5% DV or less is low, and 20% DV or more is high. (There is no %DV for trans fat.) Use the Nutrition Facts panel to choose foods low in saturated fat and cholesterol, and if the trans fat is not listed, read the ingredients and limit products that list shortening or hydrogenated or partially hydrogenated vegetable oil, which tend to be high in trans fat. POINTS TO REMEMBER:   Discuss your risk for heart disease with your caregivers, and take steps to reduce risk factors.  Change your diet. Choose foods that are low in saturated fat, trans fat, and cholesterol.  Add exercise to your daily routine if   it is not already being done. Participate in physical activity of moderate intensity, like brisk walking, for at least 30 minutes on most, and preferably all days of the week. No time? Break the 30 minutes into three, 10-minute segments during the day.  Stop smoking. If you do smoke, contact your caregiver to discuss ways in which they can help you quit.  Do not use street drugs.  Maintain a normal weight.  Maintain a healthy blood pressure.  Keep up with your blood work for checking the fats in your blood as directed by your caregiver. Document Released: 09/11/2004 Document Revised: 05/25/2012 Document Reviewed: 04/09/2009 [facility] Patient Information 2014 [place], [place]. Place hyperlipidemia patient instructions here.

## 2013-06-16 ENCOUNTER — Encounter: Payer: Self-pay | Admitting: Family Medicine

## 2013-06-20 ENCOUNTER — Telehealth: Payer: Self-pay | Admitting: Family Medicine

## 2013-06-20 NOTE — Telephone Encounter (Signed)
Wants his particle numbers from labs, because that was the whole reason he was on medication if that's it he was wondering if he could reduce his cholesterol med

## 2013-06-21 NOTE — Telephone Encounter (Signed)
Standard Lipid was done and LDL was 72 and would stay on current dose of statin

## 2013-06-21 NOTE — Telephone Encounter (Signed)
rtrnd call that lipoprofile not done, regular lipid labs ordered & to continue current dose.

## 2013-09-22 ENCOUNTER — Ambulatory Visit (INDEPENDENT_AMBULATORY_CARE_PROVIDER_SITE_OTHER): Payer: 59 | Admitting: Nurse Practitioner

## 2013-09-22 VITALS — BP 127/88 | HR 62 | Temp 96.2°F | Wt 199.0 lb

## 2013-09-22 DIAGNOSIS — H60392 Other infective otitis externa, left ear: Secondary | ICD-10-CM

## 2013-09-22 DIAGNOSIS — H60399 Other infective otitis externa, unspecified ear: Secondary | ICD-10-CM

## 2013-09-22 MED ORDER — CIPROFLOXACIN-DEXAMETHASONE 0.3-0.1 % OT SUSP: 4.0000 [drp] | Freq: Two times a day (BID) | OTIC | Status: DC

## 2013-09-22 NOTE — Patient Instructions (Signed)
Otitis Externa Otitis externa is a bacterial or fungal infection of the outer ear canal. This is the area from the eardrum to the outside of the ear. Otitis externa is sometimes called "swimmer's ear." CAUSES  Possible causes of infection include:  Swimming in dirty water.  Moisture remaining in the ear after swimming or bathing.  Mild injury (trauma) to the ear.  Objects stuck in the ear (foreign body).  Cuts or scrapes (abrasions) on the outside of the ear. SYMPTOMS  The first symptom of infection is often itching in the ear canal. Later signs and symptoms may include swelling and redness of the ear canal, ear pain, and yellowish-white fluid (pus) coming from the ear. The ear pain may be worse when pulling on the earlobe. DIAGNOSIS  Your caregiver will perform a physical exam. A sample of fluid may be taken from the ear and examined for bacteria or fungi. TREATMENT  Antibiotic ear drops are often given for 10 to 14 days. Treatment may also include pain medicine or corticosteroids to reduce itching and swelling. PREVENTION   Keep your ear dry. Use the corner of a towel to absorb water out of the ear canal after swimming or bathing.  Avoid scratching or putting objects inside your ear. This can damage the ear canal or remove the protective wax that lines the canal. This makes it easier for bacteria and fungi to grow.  Avoid swimming in lakes, polluted water, or poorly chlorinated pools.  You may use ear drops made of rubbing alcohol and vinegar after swimming. Combine equal parts of white vinegar and alcohol in a bottle. Put 3 or 4 drops into each ear after swimming. HOME CARE INSTRUCTIONS   Apply antibiotic ear drops to the ear canal as prescribed by your caregiver.  Only take over-the-counter or prescription medicines for pain, discomfort, or fever as directed by your caregiver.  If you have diabetes, follow any additional treatment instructions from your caregiver.  Keep all  follow-up appointments as directed by your caregiver. SEEK MEDICAL CARE IF:   You have a fever.  Your ear is still red, swollen, painful, or draining pus after 3 days.  Your redness, swelling, or pain gets worse.  You have a severe headache.  You have redness, swelling, pain, or tenderness in the area behind your ear. MAKE SURE YOU:   Understand these instructions.  Will watch your condition.  Will get help right away if you are not doing well or get worse. Document Released: 11/24/2005 Document Revised: 02/16/2012 Document Reviewed: 12/11/2011 [employer] Patient Information 2014 [employer].  

## 2013-09-22 NOTE — Progress Notes (Signed)
  Subjective:    Patient ID: Earl Martinez, male    DOB: 04-28-1961, 52 y.o.   MRN: [id number]  HPI Patient in today c/o left ear pain - started about 2 weeks ago- started draining about 1 week ago.    Review of Systems  Constitutional: Negative.   HENT: Positive for ear discharge and ear pain. Negative for facial swelling and hearing loss.   Eyes: Negative.   Respiratory: Negative.   Cardiovascular: Negative.   Genitourinary: Negative.        Objective:   Physical Exam  HENT:  Right Ear: Hearing, tympanic membrane, external ear and ear canal normal.  Left Ear: Hearing and tympanic membrane normal. There is drainage (yellowish scant amount).  Nose: Nose normal.  Mouth/Throat: Uvula is midline, oropharynx is clear and moist and mucous membranes are normal.  Cardiovascular: Normal rate and normal heart sounds.   Pulmonary/Chest: Effort normal and breath sounds normal.  Skin: Skin is warm.  Psychiatric: He has a normal mood and affect. His behavior is normal. Judgment and thought content normal.   BP 127/88  Pulse 62  Temp(Src) 96.2 F (35.7 C) (Oral)  Wt 199 lb (90.266 kg)  BMI 27.77 kg/m2        Assessment & Plan:  1. Otitis, externa, infective, left Avoid getting water in ear Do not stick anything in ear Meds ordered this encounter  Medications  . ciprofloxacin-dexamethasone (CIPRODEX) otic suspension    Sig: Place 4 drops into the left ear 2 (two) times daily.    Dispense:  7.5 mL    Refill:  0    Order Specific Question:  Supervising Provider    Answer:  Ernestina Penna [[id number]]   RTOprn  Mary-Margaret Daphine Deutscher, FNP

## 2013-10-13 ENCOUNTER — Other Ambulatory Visit: Payer: Self-pay

## 2013-10-18 ENCOUNTER — Encounter: Payer: Self-pay | Admitting: Internal Medicine

## 2013-10-18 ENCOUNTER — Encounter: Payer: Self-pay | Admitting: Family Medicine

## 2013-10-18 ENCOUNTER — Ambulatory Visit (INDEPENDENT_AMBULATORY_CARE_PROVIDER_SITE_OTHER): Payer: 59 | Admitting: Family Medicine

## 2013-10-18 VITALS — BP 130/79 | HR 58 | Temp 97.0°F | Ht 71.5 in | Wt 200.8 lb

## 2013-10-18 DIAGNOSIS — M542 Cervicalgia: Secondary | ICD-10-CM

## 2013-10-18 DIAGNOSIS — H60392 Other infective otitis externa, left ear: Secondary | ICD-10-CM

## 2013-10-18 DIAGNOSIS — H60399 Other infective otitis externa, unspecified ear: Secondary | ICD-10-CM

## 2013-10-18 DIAGNOSIS — Z1211 Encounter for screening for malignant neoplasm of colon: Secondary | ICD-10-CM

## 2013-10-18 MED ORDER — CIPROFLOXACIN-DEXAMETHASONE 0.3-0.1 % OT SUSP: 4.0000 [drp] | Freq: Two times a day (BID) | OTIC | Status: DC

## 2013-10-18 MED ORDER — NAPROXEN 500 MG PO TABS: 500.0000 mg | ORAL_TABLET | Freq: Two times a day (BID) | ORAL | Status: DC

## 2013-10-18 MED ORDER — CYCLOBENZAPRINE HCL 10 MG PO TABS: 10.0000 mg | ORAL_TABLET | Freq: Three times a day (TID) | ORAL | Status: DC | PRN

## 2013-10-18 NOTE — Progress Notes (Signed)
  Subjective:    Patient ID: Earl Martinez, male    DOB: 08-11-1961, 52 y.o.   MRN: [id number]  HPI  This 52 y.o. male presents for evaluation of follow up visit.  He has some neck pain and he has hx Of DDD and s/p surgery of cervical spine. He c/o some back pain on occasion.  He has moderate Pain in his left shoulder and had xrays and states they were normal.  Review of Systems C/o neck and back pain. No chest pain, SOB, HA, dizziness, vision change, N/V, diarrhea, constipation, dysuria or rash.     Objective:   Physical Exam  Vital signs noted  Well developed well nourished male.  HEENT - Head atraumatic Normocephalic                Eyes - PERRLA, Conjuctiva - clear Sclera- Clear EOMI                Ears - EAC's Wnl TM's Wnl Gross Hearing WNL                Nose - Nares patent                 Throat - oropharanx wnl Respiratory - Lungs CTA bilateral Cardiac - RRR S1 and S2 without murmur GI - Abdomen soft Nontender and bowel sounds active x 4 Extremities - No edema. Neuro - Grossly intact. MS - TTP cervical paraspinous muscles. Right shoulder with normal strength and ROM. Negative NEER and Hawkins.      Assessment & Plan:  Cervicalgia - Plan: naproxen (NAPROSYN) 500 MG tablet, cyclobenzaprine (FLEXERIL) 10 MG tablet  Encounter for screening colonoscopy - Plan: Ambulatory referral to Gastroenterology  Otitis, externa, infective, left - Plan: ciprofloxacin-dexamethasone (CIPRODEX) otic suspension  Deatra Canter FNP

## 2013-10-18 NOTE — Progress Notes (Signed)
  Subjective:    Patient ID: Earl Martinez, male    DOB: 24-Oct-1961, 52 y.o.   MRN: [id number]  HPI Pt here for follow up and management of medical problems. Wants referral for colonscopy            Patient Active Problem List   Diagnosis Date Noted  . Other and unspecified hyperlipidemia 03/25/2011  . Seasonal allergies      Current outpatient prescriptions:fish oil-omega-3 fatty acids 1000 MG capsule, Take 2 g by mouth daily., Disp: , Rfl: ;  Multiple Vitamin (MULTIVITAMIN) tablet, Take 1 tablet by mouth daily., Disp: , Rfl:  Review of Systems     Objective:   Physical Exam BP 130/79  Pulse 58  Temp(Src) 97 F (36.1 C) (Oral)  Ht 5' 11.5" (1.816 m)  Wt 200 lb 12.8 oz (91.082 kg)  BMI 27.62 kg/m2        Assessment & Plan:

## 2013-10-18 NOTE — Patient Instructions (Signed)
Back Pain, Adult Low back pain is very common. About 1 in 5 people have back pain.The cause of low back pain is rarely dangerous. The pain often gets better over time.About half of people with a sudden onset of back pain feel better in just 2 weeks. About 8 in 10 people feel better by 6 weeks.  CAUSES Some common causes of back pain include:  Strain of the muscles or ligaments supporting the spine.  Wear and tear (degeneration) of the spinal discs.  Arthritis.  Direct injury to the back. DIAGNOSIS Most of the time, the direct cause of low back pain is not known.However, back pain can be treated effectively even when the exact cause of the pain is unknown.Answering your caregiver's questions about your overall health and symptoms is one of the most accurate ways to make sure the cause of your pain is not dangerous. If your caregiver needs more information, he or she may order lab work or imaging tests (X-rays or MRIs).However, even if imaging tests show changes in your back, this usually does not require surgery. HOME CARE INSTRUCTIONS For many people, back pain returns.Since low back pain is rarely dangerous, it is often a condition that people can learn to manageon their own.   Remain active. It is stressful on the back to sit or stand in one place. Do not sit, drive, or stand in one place for more than 30 minutes at a time. Take short walks on level surfaces as soon as pain allows.Try to increase the length of time you walk each day.  Do not stay in bed.Resting more than 1 or 2 days can delay your recovery.  Do not avoid exercise or work.Your body is made to move.It is not dangerous to be active, even though your back may hurt.Your back will likely heal faster if you return to being active before your pain is gone.  Pay attention to your body when you bend and lift. Many people have less discomfortwhen lifting if they bend their knees, keep the load close to their bodies,and  avoid twisting. Often, the most comfortable positions are those that put less stress on your recovering back.  Find a comfortable position to sleep. Use a firm mattress and lie on your side with your knees slightly bent. If you lie on your back, put a pillow under your knees.  Only take over-the-counter or prescription medicines as directed by your caregiver. Over-the-counter medicines to reduce pain and inflammation are often the most helpful.Your caregiver may prescribe muscle relaxant drugs.These medicines help dull your pain so you can more quickly return to your normal activities and healthy exercise.  Put ice on the injured area.  Put ice in a plastic bag.  Place a towel between your skin and the bag.  Leave the ice on for 15-20 minutes, 03-04 times a day for the first 2 to 3 days. After that, ice and heat may be alternated to reduce pain and spasms.  Ask your caregiver about trying back exercises and gentle massage. This may be of some benefit.  Avoid feeling anxious or stressed.Stress increases muscle tension and can worsen back pain.It is important to recognize when you are anxious or stressed and learn ways to manage it.Exercise is a great option. SEEK MEDICAL CARE IF:  You have pain that is not relieved with rest or medicine.  You have pain that does not improve in 1 week.  You have new symptoms.  You are generally not feeling well. SEEK   IMMEDIATE MEDICAL CARE IF:   You have pain that radiates from your back into your legs.  You develop new bowel or bladder control problems.  You have unusual weakness or numbness in your arms or legs.  You develop nausea or vomiting.  You develop abdominal pain.  You feel faint. Document Released: 11/24/2005 Document Revised: 05/25/2012 Document Reviewed: 04/14/2011 [employer] Patient Information 2014 [employer].  

## 2013-12-15 ENCOUNTER — Ambulatory Visit: Payer: 59 | Admitting: Family Medicine

## 2013-12-23 ENCOUNTER — Ambulatory Visit (AMBULATORY_SURGERY_CENTER): Payer: Self-pay

## 2013-12-23 VITALS — Ht 71.0 in | Wt 200.0 lb

## 2013-12-23 DIAGNOSIS — Z1211 Encounter for screening for malignant neoplasm of colon: Secondary | ICD-10-CM

## 2013-12-23 MED ORDER — SUPREP BOWEL PREP KIT 17.5-3.13-1.6 GM/177ML PO SOLN: 1.0000 | Freq: Once | ORAL | Status: DC

## 2013-12-29 ENCOUNTER — Encounter: Payer: Self-pay | Admitting: Internal Medicine

## 2014-01-02 ENCOUNTER — Encounter: Payer: Self-pay | Admitting: Internal Medicine

## 2014-01-02 ENCOUNTER — Ambulatory Visit (AMBULATORY_SURGERY_CENTER): Payer: 59 | Admitting: Internal Medicine

## 2014-01-02 VITALS — BP 125/85 | HR 55 | Temp 97.2°F | Resp 18 | Ht 71.0 in | Wt 200.0 lb

## 2014-01-02 DIAGNOSIS — Z1211 Encounter for screening for malignant neoplasm of colon: Secondary | ICD-10-CM

## 2014-01-02 MED ORDER — SODIUM CHLORIDE 0.9 % IV SOLN: 500.0000 mL | INTRAVENOUS | Status: DC

## 2014-01-02 NOTE — Op Note (Signed)
[place]  [employer] [address] [place], [address]   COLONOSCOPY PROCEDURE REPORT  PATIENT: Earl, Martinez  MR#: [id number] BIRTHDATE: 1961/07/26 , 52  yrs. old GENDER: Male ENDOSCOPIST: Gatha Mayer, MD, [facility] REFERRED BY:   Stevan Born, NP PROCEDURE DATE:  01/02/2014 PROCEDURE:   Colonoscopy, screening First Screening Colonoscopy - Avg.  risk and is 50 yrs.  old or older Yes.  Prior Negative Screening - Now for repeat screening. N/A  History of Adenoma - Now for follow-up colonoscopy & has been > or = to 3 yrs.  N/A  Polyps Removed Today? No.  Recommend repeat exam, <10 yrs? No. ASA CLASS:   Class I INDICATIONS:average risk screening and first colonoscopy. MEDICATIONS: propofol (Diprivan) 200mg  IV, MAC sedation, administered by CRNA, and These medications were titrated to patient response per physician's verbal order  DESCRIPTION OF PROCEDURE:   After the risks benefits and alternatives of the procedure were thoroughly explained, informed consent was obtained.  A digital rectal exam revealed no abnormalities of the rectum.   The [id number]  endoscope was introduced through the anus and advanced to the cecum, which was identified by both the appendix and ileocecal valve. No adverse events experienced.   The quality of the prep was Suprep good  The instrument was then slowly withdrawn as the colon was fully examined.      COLON FINDINGS: A normal appearing cecum, ileocecal valve, and appendiceal orifice were identified.  The ascending, hepatic flexure, transverse, splenic flexure, descending, sigmoid colon and rectum appeared unremarkable.  No polyps or cancers were seen.   A right colon retroflexion was performed.  Retroflexed views revealed no abnormalities. The time to cecum=1 minutes 52 seconds. Withdrawal time=8 minutes 48 seconds.  The scope was withdrawn and the procedure completed. COMPLICATIONS: There were no  complications.  ENDOSCOPIC IMPRESSION: Normal colonoscopy - good prep - first colonoscopy  RECOMMENDATIONS: Repeat colonoscopy 10 years - 2025   eSigned:  Gatha Mayer, MD, [facility] 01/02/2014 10:57 AM   cc: The Patient  and Napoleon Form, NP

## 2014-01-02 NOTE — Patient Instructions (Addendum)
Your colonoscopy was normal.  Next routine colonoscopy in 10 years - 2025  I appreciate the opportunity to care for you. Carl E. Gessner, MD, FACG   YOU HAD AN ENDOSCOPIC PROCEDURE TODAY AT THE [facility]: Refer to the procedure report that was given to you for any specific questions about what was found during the examination.  If the procedure report does not answer your questions, please call your gastroenterologist to clarify.  If you requested that your care partner not be given the details of your procedure findings, then the procedure report has been included in a sealed envelope for you to review at your convenience later.  YOU SHOULD EXPECT: Some feelings of bloating in the abdomen. Passage of more gas than usual.  Walking can help get rid of the air that was put into your GI tract during the procedure and reduce the bloating. If you had a lower endoscopy (such as a colonoscopy or flexible sigmoidoscopy) you may notice spotting of blood in your stool or on the toilet paper. If you underwent a bowel prep for your procedure, then you may not have a normal bowel movement for a few days.  DIET: Your first meal following the procedure should be a light meal and then it is ok to progress to your normal diet.  A half-sandwich or bowl of soup is an example of a good first meal.  Heavy or fried foods are harder to digest and may make you feel nauseous or bloated.  Likewise meals heavy in dairy and vegetables can cause extra gas to form and this can also increase the bloating.  Drink plenty of fluids but you should avoid alcoholic beverages for 24 hours.  ACTIVITY: Your care partner should take you home directly after the procedure.  You should plan to take it easy, moving slowly for the rest of the day.  You can resume normal activity the day after the procedure however you should NOT DRIVE or use heavy machinery for 24 hours (because of the sedation medicines used during the test).     SYMPTOMS TO REPORT IMMEDIATELY: A gastroenterologist can be reached at any hour.  During normal business hours, 8:30 AM to 5:00 PM Monday through Friday, call [phone].  After hours and on weekends, please call the GI answering service at [phone] who will take a message and have the physician on call contact you.   Following lower endoscopy (colonoscopy or flexible sigmoidoscopy):  Excessive amounts of blood in the stool  Significant tenderness or worsening of abdominal pains  Swelling of the abdomen that is new, acute  Fever of 100F or higher  FOLLOW UP: If any biopsies were taken you will be contacted by phone or by letter within the next 1-3 weeks.  Call your gastroenterologist if you have not heard about the biopsies in 3 weeks.  Our staff will call the home number listed on your records the next business day following your procedure to check on you and address any questions or concerns that you may have at that time regarding the information given to you following your procedure. This is a courtesy call and so if there is no answer at the home number and we have not heard from you through the emergency physician on call, we will assume that you have returned to your regular daily activities without incident.  SIGNATURES/CONFIDENTIALITY: You and/or your care partner have signed paperwork which will be entered into your electronic medical record.  These   signatures attest to the fact that that the information above on your After Visit Summary has been reviewed and is understood.  Full responsibility of the confidentiality of this discharge information lies with you and/or your care-partner. 

## 2014-01-02 NOTE — Progress Notes (Signed)
Procedure ends, to recovery, report given and VSS. 

## 2014-01-03 ENCOUNTER — Telehealth: Payer: Self-pay | Admitting: *Deleted

## 2014-01-03 NOTE — Telephone Encounter (Signed)
  Follow up Call-  Call back number 01/02/2014  Post procedure Call Back phone  # [phone]  Permission to leave phone message Yes     Patient questions:  Left message to call us if he needed [place].

## 2014-03-14 ENCOUNTER — Ambulatory Visit (INDEPENDENT_AMBULATORY_CARE_PROVIDER_SITE_OTHER): Payer: 59 | Admitting: General Practice

## 2014-03-14 VITALS — BP 136/74 | HR 105 | Temp 97.7°F | Ht 71.0 in | Wt 202.0 lb

## 2014-03-14 DIAGNOSIS — K529 Noninfective gastroenteritis and colitis, unspecified: Secondary | ICD-10-CM

## 2014-03-14 DIAGNOSIS — K5289 Other specified noninfective gastroenteritis and colitis: Secondary | ICD-10-CM

## 2014-03-14 DIAGNOSIS — R112 Nausea with vomiting, unspecified: Secondary | ICD-10-CM

## 2014-03-14 MED ORDER — ONDANSETRON 4 MG PO TBDP
4.0000 mg | ORAL_TABLET | Freq: Once | ORAL | Status: AC
  Administered 2014-03-14: 4 mg via ORAL

## 2014-03-14 MED ORDER — ONDANSETRON HCL 4 MG PO TABS: 4.0000 mg | ORAL_TABLET | Freq: Three times a day (TID) | ORAL | Status: DC | PRN

## 2014-03-14 NOTE — Progress Notes (Signed)
   Subjective:    Patient ID: Earl Martinez, male    DOB: [birth year], 53 y.o.   MRN: [id number]  Diarrhea  This is a new problem. The current episode started yesterday. The problem occurs 2 to 4 times per day. The problem has been unchanged. The patient states that diarrhea awakens him from sleep. Associated symptoms include headaches and vomiting. Pertinent negatives include no abdominal pain, bloating, chills or fever. He has tried nothing for the symptoms. There is no history of bowel resection, inflammatory bowel disease or a recent abdominal surgery.  Emesis  This is a new problem. The current episode started yesterday. The problem occurs less than 2 times per day. The problem has been gradually improving. There has been no fever. Associated symptoms include diarrhea and headaches. Pertinent negatives include no abdominal pain, chest pain, chills or fever. He has tried nothing for the symptoms.      Review of Systems  Constitutional: Negative for fever and chills.  Respiratory: Negative for chest tightness and shortness of breath.   Cardiovascular: Negative for chest pain and palpitations.  Gastrointestinal: Positive for vomiting and diarrhea. Negative for abdominal pain, blood in stool, anal bleeding and bloating.  Neurological: Positive for headaches.       Objective:   Physical Exam  Constitutional: He is oriented to person, place, and time. He appears well-developed and well-nourished.  Cardiovascular: Regular rhythm and normal heart sounds.  Tachycardia present.   Pulmonary/Chest: Effort normal and breath sounds normal. No respiratory distress. He exhibits no tenderness.  Abdominal: Soft. Bowel sounds are normal. He exhibits no distension. There is no tenderness.  Neurological: He is alert and oriented to person, place, and time.  Skin: Skin is warm and dry.  Psychiatric: He has a normal mood and affect.          Assessment & Plan:  1. Nausea with vomiting  -  ondansetron (ZOFRAN) 4 MG tablet; Take 1 tablet (4 mg total) by mouth every 8 (eight) hours as needed for nausea or vomiting.  Dispense: 20 tablet; Refill: 0 - ondansetron (ZOFRAN-ODT) disintegrating tablet 4 mg; Take 1 tablet (4 mg total) by mouth once.  2. Gastroenteritis -provided and discussed patient information  -RTO if symptoms worsen or unresolved -may seek emergency medical treatment Patient verbalized understanding Erby Pian, FNP-C

## 2014-03-14 NOTE — Patient Instructions (Signed)
Viral Gastroenteritis Viral gastroenteritis is also known as stomach flu. This condition affects the stomach and intestinal tract. It can cause sudden diarrhea and vomiting. The illness typically lasts 3 to 8 days. Most people develop an immune response that eventually gets rid of the virus. While this natural response develops, the virus can make you quite ill. CAUSES  Many different viruses can cause gastroenteritis, such as rotavirus or noroviruses. You can catch one of these viruses by consuming contaminated food or water. You may also catch a virus by sharing utensils or other personal items with an infected person or by touching a contaminated surface. SYMPTOMS  The most common symptoms are diarrhea and vomiting. These problems can cause a severe loss of body fluids (dehydration) and a body salt (electrolyte) imbalance. Other symptoms may include:  Fever.  Headache.  Fatigue.  Abdominal pain. DIAGNOSIS  Your caregiver can usually diagnose viral gastroenteritis based on your symptoms and a physical exam. A stool sample may also be taken to test for the presence of viruses or other infections. TREATMENT  This illness typically goes away on its own. Treatments are aimed at rehydration. The most serious cases of viral gastroenteritis involve vomiting so severely that you are not able to keep fluids down. In these cases, fluids must be given through an intravenous line (IV). HOME CARE INSTRUCTIONS   Drink enough fluids to keep your urine clear or pale yellow. Drink small amounts of fluids frequently and increase the amounts as tolerated.  Ask your caregiver for specific rehydration instructions.  Avoid:  Foods high in sugar.  Alcohol.  Carbonated drinks.  Tobacco.  Juice.  Caffeine drinks.  Extremely hot or cold fluids.  Fatty, greasy foods.  Too much intake of anything at one time.  Dairy products until 24 to 48 hours after diarrhea stops.  You may consume probiotics.  Probiotics are active cultures of beneficial bacteria. They may lessen the amount and number of diarrheal stools in adults. Probiotics can be found in yogurt with active cultures and in supplements.  Wash your hands well to avoid spreading the virus.  Only take over-the-counter or prescription medicines for pain, discomfort, or fever as directed by your caregiver. Do not give aspirin to children. Antidiarrheal medicines are not recommended.  Ask your caregiver if you should continue to take your regular prescribed and over-the-counter medicines.  Keep all follow-up appointments as directed by your caregiver. SEEK IMMEDIATE MEDICAL CARE IF:   You are unable to keep fluids down.  You do not urinate at least once every 6 to 8 hours.  You develop shortness of breath.  You notice blood in your stool or vomit. This may look like coffee grounds.  You have abdominal pain that increases or is concentrated in one small area (localized).  You have persistent vomiting or diarrhea.  You have a fever.  The patient is a child younger than 3 months, and he or she has a fever.  The patient is a child older than 3 months, and he or she has a fever and persistent symptoms.  The patient is a child older than 3 months, and he or she has a fever and symptoms suddenly get worse.  The patient is a baby, and he or she has no tears when crying. MAKE SURE YOU:   Understand these instructions.  Will watch your condition.  Will get help right away if you are not doing well or get worse. Document Released: 11/24/2005 Document Revised: 02/16/2012 Document Reviewed: 09/10/2011   ExitCare Patient Information 2014 [employer].  

## 2014-04-20 ENCOUNTER — Ambulatory Visit (INDEPENDENT_AMBULATORY_CARE_PROVIDER_SITE_OTHER): Payer: 59 | Admitting: Physician Assistant

## 2014-04-20 ENCOUNTER — Encounter: Payer: Self-pay | Admitting: Physician Assistant

## 2014-04-20 VITALS — BP 139/83 | HR 58 | Temp 97.8°F | Wt 204.0 lb

## 2014-04-20 DIAGNOSIS — N4889 Other specified disorders of penis: Secondary | ICD-10-CM

## 2014-04-20 DIAGNOSIS — N489 Disorder of penis, unspecified: Secondary | ICD-10-CM

## 2014-04-20 NOTE — Progress Notes (Signed)
Subjective:     Patient ID: Earl Martinez, male   DOB: Dec 24, 1960, 53 y.o.   MRN: [id number]  HPI Pt has noted a prominent area to the mid shaft of the penis Sl TTP of the area Denies any trauma to the area No hx of same  Review of Systems Denies any polyuria, dysuria, or hematuria No pain with ejaculation No painful erections or pain with intercourse    Objective:   Physical Exam No ing nodes Circum. Testes down No apprec hernia No lesions to the skin Sl prom noted to the superior mid- shaft of the penis    Assessment:     Penile pain    Plan:     Just would like to observe for now Avoid manipulation of the area Pt to return if any worsening of sx

## 2014-07-17 ENCOUNTER — Other Ambulatory Visit: Payer: Self-pay | Admitting: Family Medicine

## 2014-07-20 ENCOUNTER — Telehealth: Payer: Self-pay | Admitting: Family Medicine

## 2014-07-20 ENCOUNTER — Other Ambulatory Visit: Payer: Self-pay | Admitting: *Deleted

## 2014-07-20 MED ORDER — ATORVASTATIN CALCIUM 40 MG PO TABS: 40.0000 mg | ORAL_TABLET | Freq: Every day | ORAL | Status: DC

## 2014-07-21 ENCOUNTER — Telehealth: Payer: Self-pay | Admitting: Family Medicine

## 2014-07-31 ENCOUNTER — Encounter: Payer: Self-pay | Admitting: Family Medicine

## 2014-07-31 ENCOUNTER — Ambulatory Visit (INDEPENDENT_AMBULATORY_CARE_PROVIDER_SITE_OTHER): Payer: 59 | Admitting: Family Medicine

## 2014-07-31 VITALS — BP 135/86 | HR 55 | Temp 97.4°F | Ht 71.5 in | Wt 208.0 lb

## 2014-07-31 DIAGNOSIS — N4889 Other specified disorders of penis: Secondary | ICD-10-CM

## 2014-07-31 DIAGNOSIS — J302 Other seasonal allergic rhinitis: Secondary | ICD-10-CM

## 2014-07-31 DIAGNOSIS — E785 Hyperlipidemia, unspecified: Secondary | ICD-10-CM

## 2014-07-31 DIAGNOSIS — Z139 Encounter for screening, unspecified: Secondary | ICD-10-CM

## 2014-07-31 DIAGNOSIS — Z0000 Encounter for general adult medical examination without abnormal findings: Secondary | ICD-10-CM

## 2014-07-31 LAB — POCT CBC
Granulocyte percent: 69.7 %G (ref 37–80)
HCT, POC: 43.7 % (ref 43.5–53.7)
Hemoglobin: 13.7 g/dL — AB (ref 14.1–18.1)
Lymph, poc: 1.8 (ref 0.6–3.4)
MCH, POC: 27 pg (ref 27–31.2)
MCHC: 31.3 g/dL — AB (ref 31.8–35.4)
MCV: 86.1 fL (ref 80–97)
MPV: 8 fL (ref 0–99.8)
POC Granulocyte: 4.6 (ref 2–6.9)
POC LYMPH PERCENT: 26.6 %L (ref 10–50)
Platelet Count, POC: 177 10*3/uL (ref 142–424)
RBC: 5.1 M/uL (ref 4.69–6.13)
RDW, POC: 13.4 %
WBC: 6.6 10*3/uL (ref 4.6–10.2)

## 2014-07-31 MED ORDER — FLUTICASONE PROPIONATE 50 MCG/ACT NA SUSP: 2.0000 | Freq: Every day | NASAL | Status: DC

## 2014-07-31 MED ORDER — ATORVASTATIN CALCIUM 40 MG PO TABS: 40.0000 mg | ORAL_TABLET | Freq: Every day | ORAL | Status: DC

## 2014-07-31 MED ORDER — LORATADINE 10 MG PO TABS: 10.0000 mg | ORAL_TABLET | Freq: Every day | ORAL | Status: DC

## 2014-07-31 NOTE — Progress Notes (Signed)
   Subjective:    Patient ID: Earl Martinez, male    DOB: 01-22-1961, 53 y.o.   MRN: [id number]  HPI This 53 y.o. male presents for evaluation of CPE.  He has hx of hyperlipidemia.  He states he was off his lipitor for awhile but went back on it when he had his eye exam and was told he has cholesterol in his arteries.  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  He c/o penile mass that has been there for months and gets tender from time to time.   Review of Systems C/o penile discomfort   No chest pain, SOB, HA, dizziness, vision change, N/V, diarrhea, constipation, dysuria, urinary urgency or frequency, myalgias, arthralgias or rash.  Objective:   Physical Exam  Vital signs noted  Well developed well nourished male.  HEENT - Head atraumatic Normocephalic                Eyes - PERRLA, Conjuctiva - clear Sclera- Clear EOMI                Ears - EAC's Wnl TM's Wnl Gross Hearing WNL                Nose - Nares patent                 Throat - oropharanx wnl Respiratory - Lungs CTA bilateral Cardiac - RRR S1 and S2 without murmur GI - Abdomen soft Nontender and bowel sounds active x 4 GU - Palpable soft mass superior mid shaft of penis.  No TTP          Penis circumcised and no inguinal hernia and testes w/o mass bilateral Rectal - Deferred Extremities - No edema. Neuro - Grossly intact.      Assessment & Plan:  Screening - Plan: POCT CBC, CMP14+EGFR, Lipid panel, Thyroid Panel With TSH, PSA, total and free  Hyperlipemia - Plan: Lipid panel, atorvastatin (LIPITOR) 40 MG tablet  Routine general medical examination at a health care facility  Seasonal allergies - Plan: loratadine (CLARITIN) 10 MG tablet, fluticasone (FLONASE) 50 MCG/ACT nasal spray  Penile mass - Plan: Ambulatory referral to Urology  Follow up in one year.  Lysbeth Penner FNP

## 2014-08-01 LAB — CMP14+EGFR
ALT: 28 IU/L (ref 0–44)
AST: 21 IU/L (ref 0–40)
Albumin/Globulin Ratio: 2 (ref 1.1–2.5)
Albumin: 4.5 g/dL (ref 3.5–5.5)
Alkaline Phosphatase: 70 IU/L (ref 39–117)
BUN/Creatinine Ratio: 13 (ref 9–20)
BUN: 13 mg/dL (ref 6–24)
CO2: 24 mmol/L (ref 18–29)
Calcium: 9.8 mg/dL (ref 8.7–10.2)
Chloride: 101 mmol/L (ref 97–108)
Creatinine, Ser: 0.99 mg/dL (ref 0.76–1.27)
GFR calc Af Amer: 102 mL/min/{1.73_m2} (ref >59)
GFR calc non Af Amer: 88 mL/min/{1.73_m2} (ref >59)
Globulin, Total: 2.3 g/dL (ref 1.5–4.5)
Glucose: 103 mg/dL — ABNORMAL HIGH (ref 65–99)
Potassium: 5.2 mmol/L (ref 3.5–5.2)
Sodium: 142 mmol/L (ref 134–144)
Total Bilirubin: 0.3 mg/dL (ref 0.0–1.2)
Total Protein: 6.8 g/dL (ref 6.0–8.5)

## 2014-08-01 LAB — LIPID PANEL
Chol/HDL Ratio: 3 ratio units (ref 0.0–5.0)
Cholesterol, Total: 145 mg/dL (ref 100–199)
HDL: 49 mg/dL (ref >39)
LDL Calculated: 74 mg/dL (ref 0–99)
Triglycerides: 111 mg/dL (ref 0–149)
VLDL Cholesterol Cal: 22 mg/dL (ref 5–40)

## 2014-08-01 LAB — PSA, TOTAL AND FREE
PSA, Free Pct: 30 %
PSA, Free: 0.6 ng/mL
PSA: 2 ng/mL (ref 0.0–4.0)

## 2014-08-01 LAB — THYROID PANEL WITH TSH
Free Thyroxine Index: 2.1 (ref 1.2–4.9)
T3 Uptake Ratio: 31 % (ref 24–39)
T4, Total: 6.9 ug/dL (ref 4.5–12.0)
TSH: 3.36 u[IU]/mL (ref 0.450–4.500)

## 2014-10-02 ENCOUNTER — Emergency Department (HOSPITAL_COMMUNITY): Payer: 59

## 2014-10-02 ENCOUNTER — Encounter (HOSPITAL_COMMUNITY): Payer: Self-pay | Admitting: Emergency Medicine

## 2014-10-02 ENCOUNTER — Emergency Department (HOSPITAL_COMMUNITY)
Admission: EM | Admit: 2014-10-02 | Discharge: 2014-10-02 | Disposition: A | Discharge status: Home / Self Care | Payer: 59 | Attending: Emergency Medicine | Admitting: Emergency Medicine

## 2014-10-02 DIAGNOSIS — R109 Unspecified abdominal pain: Secondary | ICD-10-CM

## 2014-10-02 DIAGNOSIS — M5431 Sciatica, right side: Secondary | ICD-10-CM | POA: Insufficient documentation

## 2014-10-02 DIAGNOSIS — R1031 Right lower quadrant pain: Secondary | ICD-10-CM | POA: Insufficient documentation

## 2014-10-02 DIAGNOSIS — Z8719 Personal history of other diseases of the digestive system: Secondary | ICD-10-CM | POA: Insufficient documentation

## 2014-10-02 DIAGNOSIS — Z79899 Other long term (current) drug therapy: Secondary | ICD-10-CM | POA: Diagnosis not present

## 2014-10-02 DIAGNOSIS — Z7951 Long term (current) use of inhaled steroids: Secondary | ICD-10-CM | POA: Diagnosis not present

## 2014-10-02 DIAGNOSIS — E785 Hyperlipidemia, unspecified: Secondary | ICD-10-CM | POA: Diagnosis not present

## 2014-10-02 DIAGNOSIS — Z7982 Long term (current) use of aspirin: Secondary | ICD-10-CM | POA: Diagnosis not present

## 2014-10-02 LAB — URINALYSIS, ROUTINE W REFLEX MICROSCOPIC
Bilirubin Urine: NEGATIVE
Glucose, UA: NEGATIVE mg/dL
Hgb urine dipstick: NEGATIVE
KETONES UR: NEGATIVE mg/dL
Leukocytes, UA: NEGATIVE
Nitrite: NEGATIVE
PROTEIN: NEGATIVE mg/dL
Specific Gravity, Urine: 1.041 — ABNORMAL HIGH (ref 1.005–1.030)
UROBILINOGEN UA: 0.2 mg/dL (ref 0.0–1.0)
pH: 5.5 (ref 5.0–8.0)

## 2014-10-02 MED ORDER — KETOROLAC TROMETHAMINE 30 MG/ML IJ SOLN
30.0000 mg | Freq: Once | INTRAMUSCULAR | Status: AC
  Administered 2014-10-02: 30 mg via INTRAMUSCULAR
  Filled 2014-10-02: qty 1

## 2014-10-02 MED ORDER — HYDROCODONE-ACETAMINOPHEN 5-325 MG PO TABS
2.0000 | ORAL_TABLET | Freq: Once | ORAL | Status: AC
  Administered 2014-10-02: 2 via ORAL
  Filled 2014-10-02: qty 2

## 2014-10-02 NOTE — ED Provider Notes (Signed)
CSN: [id number]     Arrival date & time 10/02/14  1708 History   First MD Initiated Contact with Patient 10/02/14 1938     Chief Complaint  Patient presents with  . Back Pain  . Flank Pain  . Hematuria     (Consider location/radiation/quality/duration/timing/severity/associated sxs/prior Treatment) HPI The patient reports he has had back pain on the right side for approximately a week. He initially notes it started in the flank and was more mild in onset. Now it has become more intense and at times he is noted to radiate around to his lower right abdomen or his leg. The pain is sharp in nature. He was noted at urgent care to have blood in the urine today and was referred to the emergency department for further evaluation. He reports his urine has appeared dark to him today. No fever no nausea no vomiting. No constipation or diarrhea. No fevers. He does indicate that sometimes now the pain seems to be in his lower right back, he indicates the sacroiliac region.  Past Medical History  Diagnosis Date  . Hyperlipemia   . Seasonal allergies   . Neck pain   . Rectal abscess    Past Surgical History  Procedure Laterality Date  . Rectal surgery  09/19/11    rectal abscess  . Neck surgery    . Nasal septum surgery     Family History  Problem Relation Age of Onset  . Colon cancer Neg Hx   . Pancreatic cancer Neg Hx   . Stomach cancer Neg Hx    History  Substance Use Topics  . Smoking status: Never Smoker   . Smokeless tobacco: Never Used  . Alcohol Use: No    Review of Systems 10 Systems reviewed and are negative for acute change except as noted in the HPI.    Allergies  Review of patient's allergies indicates no known allergies.  Home Medications   Prior to Admission medications   Medication Sig Start Date End Date Taking? Authorizing Provider  aspirin 81 MG tablet Take 81 mg by mouth daily.   Yes Historical Provider, MD  atorvastatin (LIPITOR) 40 MG tablet Take 1  tablet (40 mg total) by mouth daily. 07/31/14  Yes Lysbeth Penner, FNP  docusate sodium (COLACE) 100 MG capsule Take 100 mg by mouth daily as needed for mild constipation.   Yes Historical Provider, MD  fish oil-omega-3 fatty acids 1000 MG capsule Take 2 g by mouth daily.   Yes Historical Provider, MD  fluticasone (FLONASE) 50 MCG/ACT nasal spray Place 2 sprays into both nostrils daily. 07/31/14  Yes Lysbeth Penner, FNP  HYDROcodone-acetaminophen (NORCO/VICODIN) 5-325 MG per tablet Take 1 tablet by mouth every 6 (six) hours as needed for moderate pain.   Yes Historical Provider, MD  ibuprofen (ADVIL,MOTRIN) 800 MG tablet Take 800 mg by mouth every 6 (six) hours as needed for moderate pain.    Yes Historical Provider, MD  loratadine (CLARITIN) 10 MG tablet Take 1 tablet (10 mg total) by mouth daily. 07/31/14  Yes Lysbeth Penner, FNP  methocarbamol (ROBAXIN) 750 MG tablet Take 750 mg by mouth 4 (four) times daily as needed for muscle spasms.   Yes Historical Provider, MD  Multiple Vitamin (MULTIVITAMIN) tablet Take 1 tablet by mouth daily.   Yes Historical Provider, MD  predniSONE (DELTASONE) 20 MG tablet Take 20 mg by mouth See admin instructions. taek 3 tablets by mouth daily X4days, 2 tabs daily for x4days, , 1  tab daily for X4days. Started medication on 10-01-14   Yes Historical Provider, MD   BP 154/91  Pulse 98  Temp(Src) 97.7 F (36.5 C) (Oral)  Resp 18  SpO2 95% Physical Exam  Constitutional: He is oriented to person, place, and time. He appears well-developed and well-nourished.  HENT:  Head: Normocephalic and atraumatic.  Eyes: EOM are normal. Pupils are equal, round, and reactive to light.  Neck: Neck supple.  Cardiovascular: Normal rate, regular rhythm, normal heart sounds and intact distal pulses.   Pulmonary/Chest: Effort normal and breath sounds normal.  Abdominal: Soft. Bowel sounds are normal. He exhibits no distension. There is no tenderness.  Musculoskeletal: Normal  range of motion. He exhibits no edema.  Neurological: He is alert and oriented to person, place, and time. He has normal strength. Coordination normal. GCS eye subscore is 4. GCS verbal subscore is 5. GCS motor subscore is 6.  Skin: Skin is warm, dry and intact.  Psychiatric: He has a normal mood and affect.    ED Course  Procedures (including critical care time) Labs Review Labs Reviewed  URINALYSIS, ROUTINE W REFLEX MICROSCOPIC - Abnormal; Notable for the following:    Color, Urine AMBER (*)    Specific Gravity, Urine 1.041 (*)    All other components within normal limits    Imaging Review Ct Renal Stone Study  10/02/2014   CLINICAL DATA:  Right flank pain.  EXAM: CT ABDOMEN AND PELVIS WITHOUT CONTRAST  TECHNIQUE: Multidetector CT imaging of the abdomen and pelvis was performed following the standard protocol without IV contrast.  COMPARISON:  None.  FINDINGS: Severe degenerative disc disease is noted at L5-S1. Visualized lung bases appear normal.  No gallstones are noted. No focal abnormality is noted in the liver, spleen or pancreas on these unenhanced images. Adrenal glands appear normal. No hydronephrosis or renal obstruction is noted. No renal or ureteral calculi are noted. The appendix appears normal. Stool is noted throughout the colon suggesting constipation. Urinary bladder appears normal. Small fat containing left inguinal hernia is noted. No abnormal fluid collection is noted. There is no evidence of abdominal aortic aneurysm. No significant adenopathy is noted.  IMPRESSION: No hydronephrosis or renal obstruction is noted. No renal or ureteral calculi are noted.  Large amount of stool is noted throughout the colon suggesting constipation.   Electronically Signed   By: Sabino Dick M.D.   On: 10/02/2014 21:08     EKG Interpretation None      MDM   Final diagnoses:  Right flank pain  Sciatica neuralgia, right   At this time there is no evidence of kidney stone. The patient  does not have any motor dysfunction. Consideration is for sciatica or possibly herniated disc without neurologic dysfunction. The patient does have follow-up arranged with orthopedics.    Charlesetta Shanks, MD 10/02/14 2231

## 2014-10-02 NOTE — ED Notes (Signed)
Updated POC with PT and family.

## 2014-10-02 NOTE — ED Notes (Addendum)
Pt here for lower back pain radiating into leg. Denies injury. sts hx of same but worst flare up. Pt currently taking prednisone and muscle relaxer. sts Dr. Trenton Gammon.

## 2014-10-02 NOTE — ED Notes (Signed)
Dr. Dorothyann Gibbs, EDP into room.  Pt now choosing to leave. Not wanting to stay for formal d/c. Declines paperwork. Unable to re-VS or review d/c paperwork. Pt/family upset. Pt alert, NAD, calm, interactive, steady gait with cane. Verbalizes "will see NSURG Dr. Annette Stable in a week".

## 2014-10-02 NOTE — ED Notes (Signed)
MD at bedside. 

## 2014-10-02 NOTE — ED Notes (Signed)
PT was seen at [facility] on Sun. For back pain and Hematuria . Pt was sent home with RX for hydrocodone, robaxin and prednisone. These meds have not relieved pain. Family reports blood in urine tonight.

## 2014-10-02 NOTE — ED Notes (Signed)
Back from radiology.

## 2014-10-02 NOTE — Discharge Instructions (Signed)
Sciatica Sciatica is pain, weakness, numbness, or tingling along the path of the sciatic nerve. The nerve starts in the lower back and runs down the back of each leg. The nerve controls the muscles in the lower leg and in the back of the knee, while also providing sensation to the back of the thigh, lower leg, and the sole of your foot. Sciatica is a symptom of another medical condition. For instance, nerve damage or certain conditions, such as a herniated disk or bone spur on the spine, pinch or put pressure on the sciatic nerve. This causes the pain, weakness, or other sensations normally associated with sciatica. Generally, sciatica only affects one side of the body. CAUSES   Herniated or slipped disc.  Degenerative disk disease.  A pain disorder involving the narrow muscle in the buttocks (piriformis syndrome).  Pelvic injury or fracture.  Pregnancy.  Tumor (rare). SYMPTOMS  Symptoms can vary from mild to very severe. The symptoms usually travel from the low back to the buttocks and down the back of the leg. Symptoms can include:  Mild tingling or dull aches in the lower back, leg, or hip.  Numbness in the back of the calf or sole of the foot.  Burning sensations in the lower back, leg, or hip.  Sharp pains in the lower back, leg, or hip.  Leg weakness.  Severe back pain inhibiting movement. These symptoms may get worse with coughing, sneezing, laughing, or prolonged sitting or standing. Also, being overweight may worsen symptoms. DIAGNOSIS  Your caregiver will perform a physical exam to look for common symptoms of sciatica. He or she may ask you to do certain movements or activities that would trigger sciatic nerve pain. Other tests may be performed to find the cause of the sciatica. These may include:  Blood tests.  X-rays.  Imaging tests, such as an MRI or CT scan. TREATMENT  Treatment is directed at the cause of the sciatic pain. Sometimes, treatment is not necessary  and the pain and discomfort goes away on its own. If treatment is needed, your caregiver may suggest:  Over-the-counter medicines to relieve pain.  Prescription medicines, such as anti-inflammatory medicine, muscle relaxants, or narcotics.  Applying heat or ice to the painful area.  Steroid injections to lessen pain, irritation, and inflammation around the nerve.  Reducing activity during periods of pain.  Exercising and stretching to strengthen your abdomen and improve flexibility of your spine. Your caregiver may suggest losing weight if the extra weight makes the back pain worse.  Physical therapy.  Surgery to eliminate what is pressing or pinching the nerve, such as a bone spur or part of a herniated disk. HOME CARE INSTRUCTIONS   Only take over-the-counter or prescription medicines for pain or discomfort as directed by your caregiver.  Apply ice to the affected area for 20 minutes, 3-4 times a day for the first 48-72 hours. Then try heat in the same way.  Exercise, stretch, or perform your usual activities if these do not aggravate your pain.  Attend physical therapy sessions as directed by your caregiver.  Keep all follow-up appointments as directed by your caregiver.  Do not wear high heels or shoes that do not provide proper support.  Check your mattress to see if it is too soft. A firm mattress may lessen your pain and discomfort. SEEK IMMEDIATE MEDICAL CARE IF:   You lose control of your bowel or bladder (incontinence).  You have increasing weakness in the lower back, pelvis, buttocks,   or legs.  You have redness or swelling of your back.  You have a burning sensation when you urinate.  You have pain that gets worse when you lie down or awakens you at night.  Your pain is worse than you have experienced in the past.  Your pain is lasting longer than 4 weeks.  You are suddenly losing weight without reason. MAKE SURE YOU:  Understand these  instructions.  Will watch your condition.  Will get help right away if you are not doing well or get worse. Document Released: 11/18/2001 Document Revised: 05/25/2012 Document Reviewed: 04/04/2012 Exit[employer] Patient Information 2015 [employer]. This information is not intended to replace advice given to you by your health care provider. Make sure you discuss any questions you have with your health care provider.  

## 2014-12-11 ENCOUNTER — Ambulatory Visit (INDEPENDENT_AMBULATORY_CARE_PROVIDER_SITE_OTHER): Payer: 59 | Admitting: Family Medicine

## 2014-12-11 ENCOUNTER — Encounter: Payer: Self-pay | Admitting: Family Medicine

## 2014-12-11 VITALS — BP 139/84 | HR 64 | Temp 97.0°F | Ht 71.5 in | Wt 206.0 lb

## 2014-12-11 DIAGNOSIS — J206 Acute bronchitis due to rhinovirus: Secondary | ICD-10-CM

## 2014-12-11 MED ORDER — AZITHROMYCIN 250 MG PO TABS: ORAL_TABLET | ORAL | Status: DC

## 2014-12-11 MED ORDER — METHYLPREDNISOLONE ACETATE 80 MG/ML IJ SUSP
80.0000 mg | Freq: Once | INTRAMUSCULAR | Status: AC
  Administered 2014-12-11: 80 mg via INTRAMUSCULAR

## 2014-12-11 NOTE — Progress Notes (Signed)
   Subjective:    Patient ID: Earl Martinez, male    DOB: 08-Mar-1961, 54 y.o.   MRN: [id number]  HPI Patient c/o uri and cough  Review of Systems  Constitutional: Negative for fever.  HENT: Negative for ear pain.   Eyes: Negative for discharge.  Respiratory: Positive for cough and wheezing.   Cardiovascular: Negative for chest pain.  Gastrointestinal: Negative for abdominal distention.  Endocrine: Negative for polyuria.  Genitourinary: Negative for difficulty urinating.  Musculoskeletal: Negative for gait problem and neck pain.  Skin: Negative for color change and rash.  Neurological: Negative for speech difficulty and headaches.  Psychiatric/Behavioral: Negative for agitation.       Objective:    BP 139/84 mmHg  Pulse 64  Temp(Src) 97 F (36.1 C) (Oral)  Ht 5' 11.5" (1.816 m)  Wt 206 lb (93.441 kg)  BMI 28.33 kg/m2 Physical Exam  Constitutional: He is oriented to person, place, and time. He appears well-developed and well-nourished.  HENT:  Head: Normocephalic and atraumatic.  Mouth/Throat: Oropharynx is clear and moist.  Eyes: Pupils are equal, round, and reactive to light.  Neck: Normal range of motion. Neck supple.  Cardiovascular: Normal rate and regular rhythm.   No murmur heard. Pulmonary/Chest: Effort normal. He has wheezes.  Abdominal: Soft. Bowel sounds are normal. There is no tenderness.  Neurological: He is alert and oriented to person, place, and time.  Skin: Skin is warm and dry.  Psychiatric: He has a normal mood and affect.          Assessment & Plan:     ICD-9-CM ICD-10-CM   1. Acute bronchitis due to Rhinovirus 466.0 J20.6 azithromycin (ZITHROMAX) 250 MG tablet   079.3  methylPREDNISolone acetate (DEPO-MEDROL) injection 80 mg   Push po fluids, rest, tylenol and motrin otc prn as directed for fever, arthralgias, and myalgias.  Follow up prn if sx's continue or persist.  No Follow-up on file.  Lysbeth Penner FNP

## 2015-06-12 ENCOUNTER — Ambulatory Visit (INDEPENDENT_AMBULATORY_CARE_PROVIDER_SITE_OTHER): Payer: 59 | Admitting: Family Medicine

## 2015-06-12 ENCOUNTER — Encounter: Payer: Self-pay | Admitting: Family Medicine

## 2015-06-12 VITALS — BP 120/80 | HR 63 | Temp 97.0°F | Ht 71.5 in | Wt 204.2 lb

## 2015-06-12 DIAGNOSIS — R110 Nausea: Secondary | ICD-10-CM | POA: Diagnosis not present

## 2015-06-12 DIAGNOSIS — R197 Diarrhea, unspecified: Secondary | ICD-10-CM | POA: Diagnosis not present

## 2015-06-12 LAB — POCT CBC
GRANULOCYTE PERCENT: 83.6 % — AB (ref 37–80)
HCT, POC: 44.8 % (ref 43.5–53.7)
Hemoglobin: 14.3 g/dL (ref 14.1–18.1)
Lymph, poc: 1.6 (ref 0.6–3.4)
MCH, POC: 27.4 pg (ref 27–31.2)
MCHC: 31.9 g/dL (ref 31.8–35.4)
MCV: 85.9 fL (ref 80–97)
MPV: 7.9 fL (ref 0–99.8)
POC GRANULOCYTE: 11.5 — AB (ref 2–6.9)
POC LYMPH PERCENT: 11.9 %L (ref 10–50)
Platelet Count, POC: 211 10*3/uL (ref 142–424)
RBC: 5.22 M/uL (ref 4.69–6.13)
RDW, POC: 13.2 %
WBC: 13.7 10*3/uL — AB (ref 4.6–10.2)

## 2015-06-12 MED ORDER — CIPROFLOXACIN HCL 500 MG PO TABS: 500.0000 mg | ORAL_TABLET | Freq: Two times a day (BID) | ORAL | Status: DC

## 2015-06-12 MED ORDER — ONDANSETRON 8 MG PO TBDP: 8.0000 mg | ORAL_TABLET | Freq: Four times a day (QID) | ORAL | Status: DC | PRN

## 2015-06-12 NOTE — Progress Notes (Signed)
Subjective:  Patient ID: Earl Martinez, male    DOB: October 03, 1961  Age: 54 y.o. MRN: [id number]  CC: gi upset   HPI Earl Martinez Onset last evening of HA, nausea and diarrhea the headache is frontal and moderate. The nausea was relieved somewhat by on ondansetron he had left over from an illness over a year ago. The diarrhea has occurred twice but has been profuse and watery. Of note is that the patient went to a [place] picnic yesterday. He does not remember eating anything with excessive mayonnaise or that tasted bad. However the night before he had eaten some raw batter that included eggs in it. He will get approximately 1 AM this morning with his head hurting and nauseous. He was not able to get back to sleep after having the episode of diarrhea.  History Earl Martinez has a past medical history of Hyperlipemia; Seasonal allergies; Neck pain; and Rectal abscess.   He has past surgical history that includes Rectal surgery (09/19/11); Neck surgery; and Nasal septum surgery.   His family history is negative for Colon cancer, Pancreatic cancer, and Stomach cancer.He reports that he has never smoked. He has never used smokeless tobacco. He reports that he does not drink alcohol or use illicit drugs.  Current Outpatient Prescriptions on File Prior to Visit  Medication Sig Dispense Refill  . aspirin 81 MG tablet Take 81 mg by mouth daily.    Marland Kitchen atorvastatin (LIPITOR) 40 MG tablet Take 1 tablet (40 mg total) by mouth daily. 30 tablet 11  . docusate sodium (COLACE) 100 MG capsule Take 100 mg by mouth daily as needed for mild constipation.    . fish oil-omega-3 fatty acids 1000 MG capsule Take 2 g by mouth daily.    Marland Kitchen ibuprofen (ADVIL,MOTRIN) 800 MG tablet Take 800 mg by mouth every 6 (six) hours as needed for moderate pain.     . Multiple Vitamin (MULTIVITAMIN) tablet Take 1 tablet by mouth daily.    Marland Kitchen loratadine (CLARITIN) 10 MG tablet Take 1 tablet (10 mg total) by mouth daily. (Patient not  taking: Reported on 06/12/2015) 30 tablet 11   No current facility-administered medications on file prior to visit.    ROS Review of Systems  Constitutional: Negative for fever, chills and diaphoresis.  HENT: Negative for congestion, rhinorrhea and sore throat.   Respiratory: Negative for cough, shortness of breath and wheezing.   Cardiovascular: Negative for chest pain.  Gastrointestinal: Negative for vomiting, constipation and blood in stool.       See history of present illness  Genitourinary: Negative for dysuria and frequency.  Musculoskeletal: Negative for joint swelling and arthralgias.  Skin: Negative for rash.  Neurological: Negative for headaches.    Objective:  BP 120/80 mmHg  Pulse 63  Temp(Src) 97 F (36.1 C) (Oral)  Ht 5' 11.5" (1.816 m)  Wt 204 lb 3.2 oz (92.625 kg)  BMI 28.09 kg/m2  BP Readings from Last 3 Encounters:  06/12/15 120/80  12/11/14 139/84  10/02/14 152/92    Wt Readings from Last 3 Encounters:  06/12/15 204 lb 3.2 oz (92.625 kg)  12/11/14 206 lb (93.441 kg)  07/31/14 208 lb (94.348 kg)     Physical Exam  Constitutional: He is oriented to person, place, and time. He appears well-developed and well-nourished. No distress.  HENT:  Head: Normocephalic and atraumatic.  Right Ear: External ear normal.  Left Ear: External ear normal.  Nose: Nose normal.  Mouth/Throat: Oropharynx is clear and  moist.  Eyes: Conjunctivae and EOM are normal. Pupils are equal, round, and reactive to light.  Neck: Normal range of motion. Neck supple. No thyromegaly present.  Cardiovascular: Normal rate, regular rhythm and normal heart sounds.   No murmur heard. Pulmonary/Chest: Effort normal and breath sounds normal. No respiratory distress. He has no wheezes. He has no rales.  Abdominal: Soft. Bowel sounds are normal. He exhibits no distension and no mass. There is tenderness (mild periumbilical tenderness). There is no rebound and no guarding.  Lymphadenopathy:      He has no cervical adenopathy.  Neurological: He is alert and oriented to person, place, and time. He has normal reflexes.  Skin: Skin is warm and dry.  Psychiatric: He has a normal mood and affect. His behavior is normal. Judgment and thought content normal.    No results found for: HGBA1C  Lab Results  Component Value Date   WBC 13.7* 06/12/2015   HGB 14.3 06/12/2015   HCT 44.8 06/12/2015   PLT 206 09/19/2011   GLUCOSE 103* 07/31/2014   CHOL 145 07/31/2014   TRIG 111 07/31/2014   HDL 49 07/31/2014   LDLCALC 74 07/31/2014   ALT 28 07/31/2014   AST 21 07/31/2014   NA 142 07/31/2014   K 5.2 07/31/2014   CL 101 07/31/2014   CREATININE 0.99 07/31/2014   BUN 13 07/31/2014   CO2 24 07/31/2014   TSH 3.360 07/31/2014   PSA 2.0 07/31/2014    Ct Renal Stone Study  10/02/2014   CLINICAL DATA:  Right flank pain.  EXAM: CT ABDOMEN AND PELVIS WITHOUT CONTRAST  TECHNIQUE: Multidetector CT imaging of the abdomen and pelvis was performed following the standard protocol without IV contrast.  COMPARISON:  None.  FINDINGS: Severe degenerative disc disease is noted at L5-S1. Visualized lung bases appear normal.  No gallstones are noted. No focal abnormality is noted in the liver, spleen or pancreas on these unenhanced images. Adrenal glands appear normal. No hydronephrosis or renal obstruction is noted. No renal or ureteral calculi are noted. The appendix appears normal. Stool is noted throughout the colon suggesting constipation. Urinary bladder appears normal. Small fat containing left inguinal hernia is noted. No abnormal fluid collection is noted. There is no evidence of abdominal aortic aneurysm. No significant adenopathy is noted.  IMPRESSION: No hydronephrosis or renal obstruction is noted. No renal or ureteral calculi are noted.  Large amount of stool is noted throughout the colon suggesting constipation.   Electronically Signed   By: Sabino Dick M.D.   On: 10/02/2014 21:08    Assessment  & Plan:   Earl Martinez was seen today for gi upset.  Diagnoses and all orders for this visit:  Diarrhea Orders: -     POCT CBC -     CMP14+EGFR -     Lipase  Nausea without vomiting Orders: -     POCT CBC -     CMP14+EGFR -     Lipase  Other orders -     ciprofloxacin (CIPRO) 500 MG tablet; Take 1 tablet (500 mg total) by mouth 2 (two) times daily. -     ondansetron (ZOFRAN-ODT) 8 MG disintegrating tablet; Take 1 tablet (8 mg total) by mouth every 6 (six) hours as needed for nausea or vomiting.   I have discontinued Mr. Allbritton fluticasone and azithromycin. I am also having him start on ciprofloxacin and ondansetron. Additionally, I am having him maintain his fish oil-omega-3 fatty acids, multivitamin, aspirin, ibuprofen, atorvastatin, loratadine, and docusate  sodium.  Meds ordered this encounter  Medications  . ciprofloxacin (CIPRO) 500 MG tablet    Sig: Take 1 tablet (500 mg total) by mouth 2 (two) times daily.    Dispense:  14 tablet    Refill:  0  . ondansetron (ZOFRAN-ODT) 8 MG disintegrating tablet    Sig: Take 1 tablet (8 mg total) by mouth every 6 (six) hours as needed for nausea or vomiting.    Dispense:  20 tablet    Refill:  1    Diarrhea handout given Follow-up: Return in about 6 weeks (around 07/24/2015) for cholesterol.  Claretta Fraise, M.D.

## 2015-06-12 NOTE — Patient Instructions (Signed)

## 2015-06-13 LAB — CMP14+EGFR
ALT: 26 IU/L (ref 0–44)
AST: 22 IU/L (ref 0–40)
Albumin/Globulin Ratio: 1.7 (ref 1.1–2.5)
Albumin: 4.3 g/dL (ref 3.5–5.5)
Alkaline Phosphatase: 74 IU/L (ref 39–117)
BUN / CREAT RATIO: 28 — AB (ref 9–20)
BUN: 21 mg/dL (ref 6–24)
Bilirubin Total: 0.3 mg/dL (ref 0.0–1.2)
CALCIUM: 9.3 mg/dL (ref 8.7–10.2)
CO2: 25 mmol/L (ref 18–29)
Chloride: 101 mmol/L (ref 97–108)
Creatinine, Ser: 0.75 mg/dL — ABNORMAL LOW (ref 0.76–1.27)
GFR calc non Af Amer: 105 mL/min/{1.73_m2} (ref >59)
GFR, EST AFRICAN AMERICAN: 122 mL/min/{1.73_m2} (ref >59)
GLOBULIN, TOTAL: 2.5 g/dL (ref 1.5–4.5)
GLUCOSE: 97 mg/dL (ref 65–99)
Potassium: 4.8 mmol/L (ref 3.5–5.2)
Sodium: 139 mmol/L (ref 134–144)
TOTAL PROTEIN: 6.8 g/dL (ref 6.0–8.5)

## 2015-06-13 LAB — LIPASE: LIPASE: 63 U/L — AB (ref 0–59)

## 2015-06-14 ENCOUNTER — Telehealth: Payer: Self-pay | Admitting: *Deleted

## 2015-06-14 NOTE — Telephone Encounter (Signed)
sxs seem to be improving Pt will call back if sxs persist or worsen Verbalizes understanding

## 2015-06-14 NOTE — Telephone Encounter (Signed)
lmtcb regarding test results. 

## 2015-06-14 NOTE — Telephone Encounter (Signed)
-----   Message from Claretta Fraise, MD sent at 06/13/2015  8:15 AM EDT ----- See how pt. Is doing. There was a borderline increase in pancreatic function. Probably insignificant, but if symptoms continue to worsen, it would need a second look.

## 2015-08-09 ENCOUNTER — Other Ambulatory Visit: Payer: Self-pay | Admitting: *Deleted

## 2015-08-09 DIAGNOSIS — E785 Hyperlipidemia, unspecified: Secondary | ICD-10-CM

## 2015-08-09 MED ORDER — ATORVASTATIN CALCIUM 40 MG PO TABS: 40.0000 mg | ORAL_TABLET | Freq: Every day | ORAL | Status: DC

## 2016-02-05 ENCOUNTER — Other Ambulatory Visit: Payer: Self-pay | Admitting: Family Medicine

## 2016-02-05 NOTE — Telephone Encounter (Signed)
Last seen 06/12/15  Dr Livia Snellen   Last lipid 07/31/14   Dr Laurance Flatten  PCP

## 2016-02-13 ENCOUNTER — Encounter: Payer: Self-pay | Admitting: Pediatrics

## 2016-02-13 ENCOUNTER — Ambulatory Visit (INDEPENDENT_AMBULATORY_CARE_PROVIDER_SITE_OTHER): Payer: 59 | Admitting: Pediatrics

## 2016-02-13 VITALS — BP 136/87 | HR 63 | Temp 97.1°F | Ht 71.5 in | Wt 204.8 lb

## 2016-02-13 DIAGNOSIS — J309 Allergic rhinitis, unspecified: Secondary | ICD-10-CM | POA: Diagnosis not present

## 2016-02-13 DIAGNOSIS — J069 Acute upper respiratory infection, unspecified: Secondary | ICD-10-CM

## 2016-02-13 MED ORDER — CETIRIZINE HCL 10 MG PO TABS: 10.0000 mg | ORAL_TABLET | Freq: Every day | ORAL | Status: AC

## 2016-02-13 MED ORDER — FLUTICASONE PROPIONATE 50 MCG/ACT NA SUSP: 2.0000 | Freq: Every day | NASAL | Status: DC

## 2016-02-13 NOTE — Patient Instructions (Signed)
Netipot with distilled water 2-3 times a day to clear out sinuses Or Normal saline nasal spray Flonase steroid nasal spray Ibuprofen 600mg  three times a day Lots of fluids Cetirizine 10mg 

## 2016-02-13 NOTE — Progress Notes (Signed)
    Subjective:    Patient ID: Earl Martinez, male    DOB: 02/09/1961, 55 y.o.   MRN: [id number]  CC: Cough; Nasal Congestion; and Fatigue   HPI: Earl Martinez is a 55 y.o. male presenting for Cough; Nasal Congestion; and Fatigue  Cough, eyes running, nose running OTC nyquil, helped some Helped with sleep some No fevers Fatigue Had carnations in his truck yesterday, caused watery eyes Around sick kids several days ago, they were coughing a lot   Depression screen [facility] 2/9 02/13/2016 06/12/2015 12/11/2014 07/31/2014  Decreased Interest 0 0 0 0  Down, Depressed, Hopeless 0 1 0 0  PHQ - 2 Score 0 1 0 0     Relevant past medical, surgical, family and social history reviewed and updated as indicated. Interim medical history since our last visit reviewed. Allergies and medications reviewed and updated.    ROS: Per HPI unless specifically indicated above  History  Smoking status  . Never Smoker   Smokeless tobacco  . Never Used    Past Medical History Patient Active Problem List   Diagnosis Date Noted  . Other and unspecified hyperlipidemia 03/25/2011  . Seasonal allergies        Objective:    BP 136/87 mmHg  Pulse 63  Temp(Src) 97.1 F (36.2 C) (Oral)  Ht 5' 11.5" (1.816 m)  Wt 204 lb 12.8 oz (92.897 kg)  BMI 28.17 kg/m2  Wt Readings from Last 3 Encounters:  02/13/16 204 lb 12.8 oz (92.897 kg)  06/12/15 204 lb 3.2 oz (92.625 kg)  12/11/14 206 lb (93.441 kg)     Gen: NAD, alert, cooperative with exam, NCAT, slightly congested EYES: EOMI, no scleral injection or icterus ENT:  TMs slightly pink b/l, OP without erythema LYMPH: no cervical LAD CV: NRRR, normal S1/S2, no murmur, distal pulses 2+ b/l Resp: CTABL, no wheezes, normal WOB Abd: +BS, soft, NTND.  Ext: No edema, warm Neuro: Alert and oriented, strength equal b/l UE and LE, coordination grossly normal MSK: normal muscle bulk     Assessment & Plan:    Earl Martinez was seen today for cough, nasal congestion  and fatigue.  Diagnoses and all orders for this visit:  Allergic rhinitis, unspecified allergic rhinitis type -     fluticasone (FLONASE) 50 MCG/ACT nasal spray; Place 2 sprays into both nostrils daily. -     cetirizine (ZYRTEC) 10 MG tablet; Take 1 tablet (10 mg total) by mouth daily.  Acute URI Discussed symptomatic care   Follow up plan: Return if symptoms worsen or fail to improve.  [name], MD [facility] 02/13/2016, 4:02 PM

## 2016-03-02 ENCOUNTER — Other Ambulatory Visit: Payer: Self-pay | Admitting: Family Medicine

## 2016-03-03 NOTE — Telephone Encounter (Signed)
Last seen 02/13/16  Dr Evette Doffing  Last lipid level 07/31/14

## 2016-03-24 ENCOUNTER — Ambulatory Visit (INDEPENDENT_AMBULATORY_CARE_PROVIDER_SITE_OTHER): Payer: Worker's Compensation | Admitting: Family Medicine

## 2016-03-24 ENCOUNTER — Encounter: Payer: Self-pay | Admitting: Family Medicine

## 2016-03-24 VITALS — BP 140/81 | HR 75 | Temp 97.4°F | Ht 71.5 in | Wt 205.8 lb

## 2016-03-24 DIAGNOSIS — S93402A Sprain of unspecified ligament of left ankle, initial encounter: Secondary | ICD-10-CM | POA: Diagnosis not present

## 2016-03-24 DIAGNOSIS — T148XXA Other injury of unspecified body region, initial encounter: Secondary | ICD-10-CM

## 2016-03-24 DIAGNOSIS — S76911A Strain of unspecified muscles, fascia and tendons at thigh level, right thigh, initial encounter: Secondary | ICD-10-CM | POA: Diagnosis not present

## 2016-03-24 MED ORDER — IBUPROFEN 800 MG PO TABS: 800.0000 mg | ORAL_TABLET | Freq: Three times a day (TID) | ORAL | Status: DC | PRN

## 2016-03-24 MED ORDER — CYCLOBENZAPRINE HCL 10 MG PO TABS: 10.0000 mg | ORAL_TABLET | Freq: Three times a day (TID) | ORAL | Status: DC | PRN

## 2016-03-24 NOTE — Progress Notes (Addendum)
BP 140/81 mmHg  Pulse 75  Temp(Src) 97.4 F (36.3 C) (Oral)  Ht 5' 11.5" (1.816 m)  Wt 205 lb 12.8 oz (93.35 kg)  BMI 28.31 kg/m2   Subjective:    Patient ID: Earl Martinez, male    DOB: May 30, 1961, 55 y.o.   MRN: [id number]  HPI: Earl Martinez is a 55 y.o. male presenting on 03/24/2016 for Left ankle pain and right thigh pain   HPI Left ankle pain and right thigh pain after fall Patient is coming in today after a fall at work. The date of injury was on 03/21/2016. 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 He went to walk the mail around to the person and accidentally stepped into a hole and twisted his left ankle and fell down on the right side of his body and thinks he may landed on his scanner on his right thigh which is causing his pain in that thigh. Since that point he has been having difficulty bearing full weight on his left ankle and his right thigh. He was seen at [facility] emergency department on 03/21/2016 and had an x-ray of his hips the right thigh and his left ankle all of which came back normal. His pain has reduced some but he is still having a lot of difficulty walking and especially difficulty getting up and down inside of vehicles. He denies any fevers or chills. He does have some bruising in those areas.he also has swelling in his ankle.  Relevant past medical, surgical, family and social history reviewed and updated as indicated. Interim medical history since our last visit reviewed. Allergies and medications reviewed and updated.  Review of Systems  Constitutional: Negative for fever.  HENT: Negative for ear discharge and ear pain.   Eyes: Negative for discharge and visual disturbance.  Respiratory: Negative for shortness of breath and wheezing.   Cardiovascular: Negative for chest pain and leg swelling.  Gastrointestinal: Negative for abdominal pain, diarrhea and constipation.  Genitourinary: Negative for difficulty  urinating.  Musculoskeletal: Positive for myalgias, joint swelling and arthralgias. Negative for back pain and gait problem.  Skin: Negative for color change and rash.  Neurological: Negative for syncope, light-headedness and headaches.  All other systems reviewed and are negative.   Per HPI unless specifically indicated above     Medication List       This list is accurate as of: 03/24/16  2:54 PM.  Always use your most recent med list.               aspirin 81 MG tablet  Take 81 mg by mouth daily.     atorvastatin 40 MG tablet  Commonly known as:  LIPITOR  TAKE 1 TABLET (40 MG TOTAL) BY MOUTH DAILY.     cetirizine 10 MG tablet  Commonly known as:  ZYRTEC  Take 1 tablet (10 mg total) by mouth daily.     cyclobenzaprine 10 MG tablet  Commonly known as:  FLEXERIL  Take 1 tablet (10 mg total) by mouth 3 (three) times daily as needed for muscle spasms.     docusate sodium 100 MG capsule  Commonly known as:  COLACE  Take 100 mg by mouth daily as needed for mild constipation.     etodolac 300 MG capsule  Commonly known as:  LODINE  Take 300 mg by mouth 3 (three) times daily.     fish oil-omega-3 fatty acids 1000 MG capsule  Take 2 g by mouth daily.     fluticasone 50 MCG/ACT nasal spray  Commonly known as:  FLONASE  Place 2 sprays into both nostrils daily.     ibuprofen 800 MG tablet  Commonly known as:  ADVIL,MOTRIN  Take 1 tablet (800 mg total) by mouth every 8 (eight) hours as needed.     multivitamin tablet  Take 1 tablet by mouth daily.           Objective:    BP 140/81 mmHg  Pulse 75  Temp(Src) 97.4 F (36.3 C) (Oral)  Ht 5' 11.5" (1.816 m)  Wt 205 lb 12.8 oz (93.35 kg)  BMI 28.31 kg/m2  Wt Readings from Last 3 Encounters:  03/24/16 205 lb 12.8 oz (93.35 kg)  02/13/16 204 lb 12.8 oz (92.897 kg)  06/12/15 204 lb 3.2 oz (92.625 kg)    Physical Exam  Constitutional: He is oriented to person, place, and time. He appears well-developed and  well-nourished. No distress.  Eyes: Conjunctivae and EOM are normal. Pupils are equal, round, and reactive to light. Right eye exhibits no discharge. No scleral icterus.  Cardiovascular: Normal rate, regular rhythm, normal heart sounds and intact distal pulses.   No murmur heard. Pulmonary/Chest: Effort normal and breath sounds normal. No respiratory distress. He has no wheezes.  Abdominal: He exhibits no distension.  Musculoskeletal: Normal range of motion. He exhibits no edema.       Left ankle: He exhibits swelling. He exhibits normal range of motion, no ecchymosis, no deformity and normal pulse. Tenderness. AITFL and posterior TFL tenderness found. Achilles tendon normal.       Right upper leg: He exhibits tenderness (Mid thigh muscular tenderness, more anterolateral). He exhibits no bony tenderness, no swelling, no edema, no deformity and no laceration.  Neurological: He is alert and oriented to person, place, and time. Coordination normal.  Skin: Skin is warm and dry. No rash noted. He is not diaphoretic.  Psychiatric: He has a normal mood and affect. His behavior is normal.  Vitals reviewed.     Assessment & Plan:   Problem List Items Addressed This Visit    None    Visit Diagnoses    Muscle contusion    -  Primary    Right thigh, x-rays negative from [facility], recommend ice heat ibuprofen and stretching and stationary bike    Relevant Medications    ibuprofen (ADVIL,MOTRIN) 800 MG tablet    cyclobenzaprine (FLEXERIL) 10 MG tablet    Left ankle sprain, initial encounter        Relevant Medications    ibuprofen (ADVIL,MOTRIN) 800 MG tablet    cyclobenzaprine (FLEXERIL) 10 MG tablet       Patient off work for one week and then may return to full duty.  Follow up plan: Return if symptoms worsen or fail to improve.  Counseling provided for all of the vaccine components No orders of the defined types were placed in this encounter.    Caryl Pina, MD [facility] 03/24/2016, 2:54 PM

## 2016-07-26 ENCOUNTER — Other Ambulatory Visit: Payer: Self-pay | Admitting: Pediatrics

## 2016-08-07 ENCOUNTER — Encounter: Payer: Self-pay | Admitting: Pediatrics

## 2016-08-07 ENCOUNTER — Ambulatory Visit (INDEPENDENT_AMBULATORY_CARE_PROVIDER_SITE_OTHER): Payer: 59 | Admitting: Pediatrics

## 2016-08-07 VITALS — BP 124/76 | HR 60 | Temp 97.0°F | Ht 71.5 in | Wt 201.2 lb

## 2016-08-07 DIAGNOSIS — Z0000 Encounter for general adult medical examination without abnormal findings: Secondary | ICD-10-CM | POA: Diagnosis not present

## 2016-08-07 DIAGNOSIS — E785 Hyperlipidemia, unspecified: Secondary | ICD-10-CM

## 2016-08-07 DIAGNOSIS — N400 Enlarged prostate without lower urinary tract symptoms: Secondary | ICD-10-CM

## 2016-08-07 DIAGNOSIS — K5900 Constipation, unspecified: Secondary | ICD-10-CM

## 2016-08-07 MED ORDER — TAMSULOSIN HCL 0.4 MG PO CAPS: 0.4000 mg | ORAL_CAPSULE | Freq: Every day | ORAL | 3 refills | Status: DC

## 2016-08-07 NOTE — Progress Notes (Signed)
  Subjective:   Patient ID: Earl Martinez, male    DOB: 03-22-1961, 55 y.o.   MRN: [id number] CC: Annual Exam  HPI: Earl Martinez is a 55 y.o. male presenting for Annual Exam  With eye exam was told they could see cholesterol a few years ago  Eats an apple a day Lower back surgery in 50s Was regularly running  Trying to eat  Rare headaches, apprx 1 month, no chest pain or SOB with exertion  Colonoscopy in 2015  Fam Hx: dad with strokes in 101s, no MIs, no colon ca  Relevant past medical, surgical, family and social history reviewed. Allergies and medications reviewed and updated.  Social History   Social History  . Marital status: Married    Spouse name: N/A  . Number of children: N/A  . Years of education: N/A   Occupational History  . Not on file.   Social History Main Topics  . Smoking status: Never Smoker  . Smokeless tobacco: Never Used  . Alcohol use No  . Drug use: No  . Sexual activity: Not on file   Other Topics Concern  . Not on file   Social History Narrative  . No narrative on file     ROS: Per HPI   Objective:    BP 124/76   Pulse 60   Temp 97 F (36.1 C)   Ht 5' 11.5" (1.816 m)   Wt 201 lb 3.2 oz (91.3 kg)   BMI 27.67 kg/m   Wt Readings from Last 3 Encounters:  08/07/16 201 lb 3.2 oz (91.3 kg)  03/24/16 205 lb 12.8 oz (93.4 kg)  02/13/16 204 lb 12.8 oz (92.9 kg)    Gen: NAD, alert, cooperative with exam, NCAT EYES: EOMI, no conjunctival injection, or no icterus ENT:  TMs pearly gray b/l, OP without erythema LYMPH: no cervical LAD CV: NRRR, normal S1/S2, no murmur, distal pulses 2+ b/l Resp: CTABL, no wheezes, normal WOB Abd: +BS, soft, NTND. no guarding or organomegaly Ext: No edema, warm Neuro: Alert and oriented, strength equal b/l UE and LE, coordination grossly normal MSK: normal muscle bulk  Assessment & Plan:  Earl Martinez was seen today for annual exam.  Diagnoses and all orders for this visit:  Encounter for preventive  health examination -     Hepatitis C antibody -     BMP8+EGFR -     Lipid panel  Hyperlipidemia  Constipation, unspecified constipation type  BPH (benign prostatic hyperplasia) -     tamsulosin (FLOMAX) 0.4 MG CAPS capsule; Take 1 capsule (0.4 mg total) by mouth daily.   Follow up plan: Return in about 1 year (around 08/07/2017). [name], MD [place]

## 2016-08-08 LAB — BMP8+EGFR
BUN / CREAT RATIO: 22 — AB (ref 9–20)
BUN: 20 mg/dL (ref 6–24)
CHLORIDE: 102 mmol/L (ref 96–106)
CO2: 27 mmol/L (ref 18–29)
Calcium: 9.6 mg/dL (ref 8.7–10.2)
Creatinine, Ser: 0.93 mg/dL (ref 0.76–1.27)
GFR calc Af Amer: 108 mL/min/{1.73_m2} (ref >59)
GFR calc non Af Amer: 93 mL/min/{1.73_m2} (ref >59)
GLUCOSE: 92 mg/dL (ref 65–99)
POTASSIUM: 4.8 mmol/L (ref 3.5–5.2)
SODIUM: 141 mmol/L (ref 134–144)

## 2016-08-08 LAB — LIPID PANEL
CHOLESTEROL TOTAL: 147 mg/dL (ref 100–199)
Chol/HDL Ratio: 3.3 ratio units (ref 0.0–5.0)
HDL: 44 mg/dL (ref >39)
LDL Calculated: 83 mg/dL (ref 0–99)
Triglycerides: 98 mg/dL (ref 0–149)
VLDL CHOLESTEROL CAL: 20 mg/dL (ref 5–40)

## 2016-08-08 LAB — HEPATITIS C ANTIBODY

## 2016-09-02 ENCOUNTER — Encounter: Payer: Self-pay | Admitting: Family Medicine

## 2016-09-02 ENCOUNTER — Ambulatory Visit (INDEPENDENT_AMBULATORY_CARE_PROVIDER_SITE_OTHER): Payer: 59 | Admitting: Family Medicine

## 2016-09-02 VITALS — BP 135/85 | HR 53 | Temp 97.0°F | Ht 71.5 in | Wt 205.0 lb

## 2016-09-02 DIAGNOSIS — M75101 Unspecified rotator cuff tear or rupture of right shoulder, not specified as traumatic: Secondary | ICD-10-CM | POA: Diagnosis not present

## 2016-09-02 NOTE — Progress Notes (Signed)
   HPI  Patient presents today here with right shoulder pain.  Patient explains that about one month ago he was doing shoulder exercises abducting his arms out laterally, he states that one of the exercises he extended past 90 and feels that he may have hurt his shoulder. He initially had posterior right shoulder pain which was dull and achy, this has transition and diffuse and anterior shoulder pain. Has some tenderness across the posterior portion.  No swelling, no weakness No neck pain No loss of function. Tried NSAIDs without significant improvement  PMH: Smoking status noted ROS: Per HPI  Objective: BP 135/85   Pulse (!) 53   Temp 97 F (36.1 C) (Oral)   Ht 5' 11.5" (1.816 m)   Wt 205 lb (93 kg)   BMI 28.19 kg/m  Gen: NAD, alert, cooperative with exam HEENT: NCAT, EOMI, PERRL CV: RRR, good S1/S2, no murmur Resp: CTABL, no wheezes, non-labored Abd: SNTND, BS present, no guarding or organomegaly Ext: No edema, warm Neuro: Alert and oriented, No gross deficits  MSK R shoulder with preserved range of motion, normal Apley scratch test Positive empty can and Hawkins sign Negative Neer sign Mild tenderness to palpation over the posterior shoulder, over the supraspinatus  R shoulder injection Informed consent obtained and placed in chart.  Time out performed.  Area cleaned with iodine x 2 and wiped clear with alcohol swab.  Using 21 1/2 gauge needle 1 cc Kenalog and 4 cc's 1% Lidocaine were injected in subacromial space via posterior approach.  Sterile bandage placed.  Patient tolerated procedure well.  No complications.     Assessment and plan:  # Rotator cuff Syndrome Subacromial injection provided in clinic today, patient tolerated easily. Discussed rehabilitation exercises, NSAIDs, and ice. Offered sports medicine referral versus orthopedic referral, he would like to defer this for now. Return to clinic in one month for follow-up    Laroy Apple, MD [facility] 09/02/2016, 11:56 AM

## 2016-09-02 NOTE — Patient Instructions (Signed)
Great to meet you!  Lets follow up in 1 month to see how your shoulder is doing.   Please let us know if you have any questions or concerns

## 2016-10-02 ENCOUNTER — Other Ambulatory Visit: Payer: Self-pay | Admitting: Pediatrics

## 2016-10-21 ENCOUNTER — Telehealth: Payer: Self-pay | Admitting: Family Medicine

## 2016-10-21 NOTE — Telephone Encounter (Signed)
Too soon for another injection. Would recommend Ortho referral if it is that severe again.   Laroy Apple, MD [facility] 10/21/2016, 6:05 PM

## 2016-10-22 NOTE — Telephone Encounter (Signed)
Patient verbalizes understanding and states he will let us know about referral.

## 2016-10-23 ENCOUNTER — Telehealth: Payer: Self-pay

## 2016-10-23 DIAGNOSIS — M75100 Unspecified rotator cuff tear or rupture of unspecified shoulder, not specified as traumatic: Secondary | ICD-10-CM

## 2016-10-23 NOTE — Telephone Encounter (Signed)
Referral written.   Earl Apple, MD [facility] 10/23/2016, 9:21 AM

## 2016-11-07 ENCOUNTER — Ambulatory Visit: Payer: 59 | Attending: Orthopedic Surgery | Admitting: Physical Therapy

## 2016-11-07 DIAGNOSIS — M6281 Muscle weakness (generalized): Secondary | ICD-10-CM | POA: Diagnosis present

## 2016-11-07 DIAGNOSIS — M25511 Pain in right shoulder: Secondary | ICD-10-CM

## 2016-11-07 NOTE — Therapy (Signed)
[facility] [place], [place], [address] Phone: [phone]   Fax:  [phone]  Physical Therapy Evaluation  Patient Details  Name: LYNDLE VIRGEN MRN: [id number] Date of Birth: Jan 21, 1961 Referring Provider: Wyatt Portela PA-C  Encounter Date: 11/07/2016      PT End of Session - 11/07/16 0939    Visit Number 1   Number of Visits 12   Date for PT Re-Evaluation 12/05/16   PT Start Time 0905   PT Stop Time 0952   PT Time Calculation (min) 47 min   Activity Tolerance Patient tolerated treatment well   Behavior During Therapy [facility] for tasks assessed/performed      Past Medical History:  Diagnosis Date  . Hyperlipemia   . Neck pain   . Rectal abscess   . Seasonal allergies     Past Surgical History:  Procedure Laterality Date  . BACK SURGERY    . NASAL SEPTUM SURGERY    . NECK SURGERY    . RECTAL SURGERY  09/19/11   rectal abscess    There were no vitals filed for this visit.       Subjective Assessment - 11/07/16 0934    Subjective The patient was working out at a gym about 2 months ago and abducting his shoulders when he felt intense pain in his right shoulder.  He has had 2 injections and the last one did help.  His resting pain-level is a 3/10 today but can go higher ([id number]) with certain movements.  He is experiencing left shoulder pain as well.   Patient Stated Goals Get out of pain.   Currently in Pain? Yes   Pain Score 3    Pain Location Shoulder   Pain Orientation Right   Pain Descriptors / Indicators Aching   Pain Type Acute pain   Pain Onset More than a month ago   Pain Frequency Constant   Aggravating Factors  See above.   Pain Relieving Factors Rest.            [facility] PT Assessment - 11/07/16 0001      Assessment   Medical Diagnosis Right shoulder Impingement.   Referring Provider Wyatt Portela PA-C     Precautions   Precautions None     Restrictions   Weight Bearing  Restrictions No     Balance Screen   Has the patient fallen in the past 6 months No   Has the patient had a decrease in activity level because of a fear of falling?  No   Is the patient reluctant to leave their home because of a fear of falling?  No     Home Ecologist residence     Prior Function   Level of Independence Independent     ROM / Strength   AROM / PROM / Strength AROM;Strength     AROM   Overall AROM Comments Full active right shoulder ROM.     Strength   Overall Strength Comments Right shoulder ER= 4 to 4+/5.     Palpation   Palpation comment Tender to palpation over right bicipital groove and posterior cuff.     Special Tests    Special Tests --  (-) Impingement test.                   [facility] Adult PT Treatment/Exercise - 11/07/16 0001      Modalities   Modalities Electrical Stimulation  Acupuncturist Location RT SHLD.   Electrical Stimulation Action IFC   Electrical Stimulation Parameters 80-150 Hz x 15 minutes.                     PT Long Term Goals - 11/07/16 1107      PT LONG TERM GOAL #1   Title Independent with a HEP.   Time 4   Period Weeks   Status New     PT LONG TERM GOAL #2   Title Right shoulder ER strength= 5/5.   Time 4   Period Weeks   Status New     PT LONG TERM GOAL #3   Title Perform ADL's with pain not > 2/10.   Time 4   Period Weeks   Status New               Plan - 11/07/16 0940    Clinical Impression Statement The patient has full right shoulder range of motion and only a minimal loss of right ER strength.  His is tender to palpation in the right posterior cuff region and bicipital groove region.   Rehab Potential Excellent   PT Frequency 3x / week   PT Duration 4 weeks   PT Treatment/Interventions ADLs/Self Care Home Management;Cryotherapy;Electrical Stimulation;Moist Heat;Ultrasound;Therapeutic  activities;Therapeutic exercise;Patient/family education;Manual techniques   PT Next Visit Plan Combo e'stim/U/S to right anterior and posterior shoulder f/b STW/M to posterior cuff; e'stim and moist heat; sdly ER; RW4 and right scapular strengthening exercises.      Patient will benefit from skilled therapeutic intervention in order to improve the following deficits and impairments:  Pain, Decreased activity tolerance  Visit Diagnosis: Acute pain of right shoulder - Plan: PT plan of care cert/re-cert  Muscle weakness (generalized) - Plan: PT plan of care cert/re-cert     Problem List Patient Active Problem List   Diagnosis Date Noted  . Constipation 08/07/2016  . Hyperlipidemia 03/25/2011  . Seasonal allergies     Geniyah Eischeid, [name] 11/07/2016, 11:10 AM  [facility] [address] [place], [place], [address] Phone: [phone]   Fax:  [phone]  Name: ORVEN ISRAELSON MRN: [id number] Date of Birth: [birth year]

## 2016-11-11 ENCOUNTER — Ambulatory Visit: Payer: 59 | Admitting: *Deleted

## 2016-11-17 ENCOUNTER — Ambulatory Visit: Payer: 59 | Admitting: Physical Therapy

## 2016-11-17 ENCOUNTER — Encounter: Payer: Self-pay | Admitting: Physical Therapy

## 2016-11-17 DIAGNOSIS — M6281 Muscle weakness (generalized): Secondary | ICD-10-CM

## 2016-11-17 DIAGNOSIS — M25511 Pain in right shoulder: Secondary | ICD-10-CM

## 2016-11-17 NOTE — Therapy (Signed)
[facility] [place], [place], [address] Phone: [phone]   Fax:  [phone]  Physical Therapy Treatment  Patient Details  Name: Earl Martinez MRN: [id number] Date of Birth: 01-23-1961 Referring Provider: Wyatt Portela PA-C  Encounter Date: 11/17/2016      PT End of Session - 11/17/16 0826    Visit Number 2   Number of Visits 12   Date for PT Re-Evaluation 12/05/16   PT Start Time 0735   PT Stop Time 0829   PT Time Calculation (min) 54 min   Activity Tolerance Patient tolerated treatment well   Behavior During Therapy [facility] for tasks assessed/performed      Past Medical History:  Diagnosis Date  . Hyperlipemia   . Neck pain   . Rectal abscess   . Seasonal allergies     Past Surgical History:  Procedure Laterality Date  . BACK SURGERY    . NASAL SEPTUM SURGERY    . NECK SURGERY    . RECTAL SURGERY  09/19/11   rectal abscess    There were no vitals filed for this visit.      Subjective Assessment - 11/17/16 0737    Subjective Patient arrived with some minor reported soreness in right shoulder   Patient Stated Goals Get out of pain.   Currently in Pain? Yes   Pain Score 3    Pain Location Shoulder   Pain Orientation Right   Pain Descriptors / Indicators Aching   Pain Type Acute pain   Pain Onset More than a month ago   Pain Frequency Constant   Aggravating Factors  certain movements   Pain Relieving Factors at rest                         [facility] Adult PT Treatment/Exercise - 11/17/16 0001      Exercises   Exercises Shoulder     Shoulder Exercises: Sidelying   External Rotation Strengthening;Right;Weights  3x10   External Rotation Weight (lbs) 2     Shoulder Exercises: Standing   Protraction Right;20 reps;Theraband;Strengthening   Theraband Level (Shoulder Protraction) Level 2 (Red)   External Rotation Strengthening;Right;20 reps;Theraband   Theraband Level (Shoulder  External Rotation) Level 2 (Red)   Internal Rotation Strengthening;Right;20 reps;Theraband   Theraband Level (Shoulder Internal Rotation) Level 2 (Red)   Row Strengthening;20 reps;Theraband;Right   Theraband Level (Shoulder Row) Level 2 (Red)  pink XTS   Other Standing Exercises pink XTS for rows x20     Shoulder Exercises: ROM/Strengthening   UBE (Upper Arm Bike) 8min 120RPM     Modalities   Modalities Electrical Stimulation;Moist Heat;Ultrasound     Moist Heat Therapy   Number Minutes Moist Heat 15 Minutes   Moist Heat Location Shoulder     Electrical Stimulation   Electrical Stimulation Location RT SHLD.   Electrical Stimulation Action IFC   Electrical Stimulation Parameters 80-150hz  x4min   Electrical Stimulation Goals Pain     Ultrasound   Ultrasound Location right ant/post shoulder   Ultrasound Parameters 1.5w/cm2/50%/49mhz x74min   Ultrasound Goals Pain                     PT Long Term Goals - 11/17/16 0827      PT LONG TERM GOAL #1   Title Independent with a HEP.   Time 4   Period Weeks   Status On-going     PT LONG TERM GOAL #2  Title Right shoulder ER strength= 5/5.   Time 4   Period Weeks   Status On-going     PT LONG TERM GOAL #3   Title Perform ADL's with pain not > 2/10.   Time 4   Period Weeks   Status On-going               Plan - 11/17/16 [id number]    Clinical Impression Statement Patient tolerated treatment well today. Patient reported pain both anterior and posterior capsule area today. Patient able to complete exercises with good technique and pain free during. Patient current goals ongoing due to pain and strength deficts.   Rehab Potential Excellent   PT Frequency 3x / week   PT Duration 4 weeks   PT Treatment/Interventions ADLs/Self Care Home Management;Cryotherapy;Electrical Stimulation;Moist Heat;Ultrasound;Therapeutic activities;Therapeutic exercise;Patient/family education;Manual techniques   PT Next Visit Plan Cont  with POC per MPT for U/S to right anterior and posterior shoulder f/b STW/M to posterior cuff; e'stim and moist heat; sdly ER; RW4 and right scapular strengthening exercises.   Consulted and Agree with Plan of Care Patient      Patient will benefit from skilled therapeutic intervention in order to improve the following deficits and impairments:  Pain, Decreased activity tolerance  Visit Diagnosis: Acute pain of right shoulder  Muscle weakness (generalized)     Problem List Patient Active Problem List   Diagnosis Date Noted  . Constipation 08/07/2016  . Hyperlipidemia 03/25/2011  . Seasonal allergies     Paulla Mcclaskey P, PTA 11/17/2016, 8:37 AM  [facility] [address] [place], [place], [address] Phone: [phone]   Fax:  [phone]  Name: Earl Martinez MRN: [id number] Date of Birth: Jan 10, 1961

## 2016-11-20 ENCOUNTER — Ambulatory Visit: Payer: 59 | Admitting: *Deleted

## 2016-11-25 ENCOUNTER — Ambulatory Visit: Payer: 59 | Admitting: Physical Therapy

## 2016-11-25 ENCOUNTER — Encounter: Payer: Self-pay | Admitting: Physical Therapy

## 2016-11-25 DIAGNOSIS — M25511 Pain in right shoulder: Secondary | ICD-10-CM | POA: Diagnosis not present

## 2016-11-25 DIAGNOSIS — M6281 Muscle weakness (generalized): Secondary | ICD-10-CM

## 2016-11-25 NOTE — Therapy (Signed)
[facility] [place], [place], [address] Phone: [phone]   Fax:  [phone]  Physical Therapy Treatment  Patient Details  Name: Earl Martinez MRN: [id number] Date of Birth: October 14, 1961 Referring Provider: Wyatt Portela PA-C  Encounter Date: 11/25/2016      PT End of Session - 11/25/16 0733    Visit Number 3   Number of Visits 12   Date for PT Re-Evaluation 12/05/16   PT Start Time 0733   PT Stop Time 0821   PT Time Calculation (min) 48 min   Activity Tolerance Patient tolerated treatment well   Behavior During Therapy [facility] for tasks assessed/performed      Past Medical History:  Diagnosis Date  . Hyperlipemia   . Neck pain   . Rectal abscess   . Seasonal allergies     Past Surgical History:  Procedure Laterality Date  . BACK SURGERY    . NASAL SEPTUM SURGERY    . NECK SURGERY    . RECTAL SURGERY  09/19/11   rectal abscess    There were no vitals filed for this visit.      Subjective Assessment - 11/25/16 0732    Subjective Reports its better but still there in regards to pain.   Patient Stated Goals Get out of pain.   Currently in Pain? Yes   Pain Score 3    Pain Location Shoulder   Pain Orientation Right   Pain Descriptors / Indicators Dull   Pain Type Acute pain   Pain Onset More than a month ago   Pain Frequency Constant   Aggravating Factors  Certain movements   Pain Relieving Factors Rest            [facility] PT Assessment - 11/25/16 0001      Assessment   Medical Diagnosis Right shoulder Impingement.   Next MD Visit 11/2016     Precautions   Precautions None     Restrictions   Weight Bearing Restrictions No                     [facility] Adult PT Treatment/Exercise - 11/25/16 0001      Shoulder Exercises: Standing   External Rotation Strengthening;Right;20 reps;Theraband   Theraband Level (Shoulder External Rotation) Level 2 (Red)   Internal Rotation  Strengthening;Right;20 reps;Theraband   Theraband Level (Shoulder Internal Rotation) Level 2 (Red)   Extension Strengthening;Right;20 reps;Theraband   Theraband Level (Shoulder Extension) Level 2 (Red)   Row Strengthening;Right;20 reps;Theraband   Theraband Level (Shoulder Row) Level 2 (Red)     Shoulder Exercises: Stretch   Internal Rotation Stretch Other (comment)  3 reps 30 sec   External Rotation Stretch 3 reps;30 seconds;Other (comment)  RLE     Modalities   Modalities Electrical Stimulation;Moist Heat;Ultrasound     Moist Heat Therapy   Number Minutes Moist Heat 15 Minutes   Moist Heat Location Shoulder     Electrical Stimulation   Electrical Stimulation Location R shoulder   Electrical Stimulation Action IFC   Electrical Stimulation Parameters 1-10 hz x15 min   Electrical Stimulation Goals Pain     Ultrasound   Ultrasound Location R ant/post shoulder   Ultrasound Parameters 1.2 w/cm2, 100%, 1 mhz x10 min   Ultrasound Goals Pain                     PT Long Term Goals - 11/17/16 0827      PT LONG TERM  GOAL #1   Title Independent with a HEP.   Time 4   Period Weeks   Status On-going     PT LONG TERM GOAL #2   Title Right shoulder ER strength= 5/5.   Time 4   Period Weeks   Status On-going     PT LONG TERM GOAL #3   Title Perform ADL's with pain not > 2/10.   Time 4   Period Weeks   Status On-going               Plan - 11/25/16 [id number]    Clinical Impression Statement Patient tolerated today's treatment fairly well with only some pain with exercises such as resisted ER but not intolerable per patient report. Patient able to complete exercises as directed with VCs for capsular stretching technique. Patient continues to experience anterior and posterior R shoulder discomfort per patient report. Normal modalities response noted following removal of the modalities.   Rehab Potential Excellent   PT Frequency 3x / week   PT Duration 4 weeks    PT Treatment/Interventions ADLs/Self Care Home Management;Cryotherapy;Electrical Stimulation;Moist Heat;Ultrasound;Therapeutic activities;Therapeutic exercise;Patient/family education;Manual techniques   PT Next Visit Plan Continue with R shoulder strengthening and capsular stretching with modalities PRN per MPT POC.   Consulted and Agree with Plan of Care Patient      Patient will benefit from skilled therapeutic intervention in order to improve the following deficits and impairments:  Pain, Decreased activity tolerance  Visit Diagnosis: Acute pain of right shoulder  Muscle weakness (generalized)     Problem List Patient Active Problem List   Diagnosis Date Noted  . Constipation 08/07/2016  . Hyperlipidemia 03/25/2011  . Seasonal allergies     Wynelle Fanny, PTA 11/25/2016, 8:27 AM  [facility] [address] [place], [place], [address] Phone: [phone]   Fax:  [phone]  Name: Earl Martinez MRN: [id number] Date of Birth: [birth year]

## 2016-11-30 ENCOUNTER — Other Ambulatory Visit: Payer: Self-pay | Admitting: Pediatrics

## 2016-11-30 DIAGNOSIS — N400 Enlarged prostate without lower urinary tract symptoms: Secondary | ICD-10-CM

## 2016-12-03 ENCOUNTER — Ambulatory Visit: Payer: 59 | Admitting: Physical Therapy

## 2016-12-03 ENCOUNTER — Encounter: Payer: Self-pay | Admitting: Physical Therapy

## 2016-12-03 DIAGNOSIS — M6281 Muscle weakness (generalized): Secondary | ICD-10-CM

## 2016-12-03 DIAGNOSIS — M25511 Pain in right shoulder: Secondary | ICD-10-CM | POA: Diagnosis not present

## 2016-12-03 NOTE — Therapy (Addendum)
[facility] [place], [place], [address] Phone: [phone]   Fax:  [phone]  Physical Therapy Treatment  Patient Details  Name: Earl Martinez MRN: [id number] Date of Birth: 1961/01/05 Referring Provider: Wyatt Portela PA-C  Encounter Date: 12/03/2016      PT End of Session - 12/03/16 0810    Visit Number 4   Number of Visits 12   Date for PT Re-Evaluation 12/05/16   PT Start Time 0733   PT Stop Time 0821   PT Time Calculation (min) 48 min   Activity Tolerance Patient tolerated treatment well   Behavior During Therapy [facility] for tasks assessed/performed      Past Medical History:  Diagnosis Date  . Hyperlipemia   . Neck pain   . Rectal abscess   . Seasonal allergies     Past Surgical History:  Procedure Laterality Date  . BACK SURGERY    . NASAL SEPTUM SURGERY    . NECK SURGERY    . RECTAL SURGERY  09/19/11   rectal abscess    There were no vitals filed for this visit.      Subjective Assessment - 12/03/16 0739    Subjective Reports its better but still there in regards to pain.   Patient Stated Goals Get out of pain.   Currently in Pain? Yes   Pain Score 3    Pain Location Shoulder   Pain Orientation Right   Pain Descriptors / Indicators Aching   Pain Type Acute pain   Pain Onset More than a month ago   Pain Frequency Intermittent   Aggravating Factors  certain movements   Pain Relieving Factors at rest                         [facility] Adult PT Treatment/Exercise - 12/03/16 0001      Shoulder Exercises: Standing   Protraction Strengthening;Right;Theraband  3x10   Theraband Level (Shoulder Protraction) Level 2 (Red)   External Rotation Strengthening;Right;Theraband  3x10   Theraband Level (Shoulder External Rotation) Level 2 (Red)   Internal Rotation Strengthening;Right;Theraband  3x10   Theraband Level (Shoulder Internal Rotation) Level 2 (Red)   Row  Strengthening;Right;Theraband  3x10   Theraband Level (Shoulder Row) Level 2 (Red)     Shoulder Exercises: ROM/Strengthening   UBE (Upper Arm Bike) 120RPM x4mn     Moist Heat Therapy   Number Minutes Moist Heat 15 Minutes   Moist Heat Location Shoulder     Electrical Stimulation   Electrical Stimulation Location R shoulder   Electrical Stimulation Action IFC   Electrical Stimulation Parameters 1-10hz  x162m   Electrical Stimulation Goals Pain     Ultrasound   Ultrasound Location right ant/post shoulder   Ultrasound Parameters 1.2w/cm2/50%/72m28mx12m69m Ultrasound Goals Pain                     PT Long Term Goals - 11/17/16 0827      PT LONG TERM GOAL #1   Title Independent with a HEP.   Time 4   Period Weeks   Status On-going     PT LONG TERM GOAL #2   Title Right shoulder ER strength= 5/5.   Time 4   Period Weeks   Status On-going     PT LONG TERM GOAL #3   Title Perform ADL's with pain not > 2/10.   Time 4   Period Weeks   Status  On-going               Plan - 12/03/16 0814    Clinical Impression Statement Patient tolerated treatment well today without pain complaints. Patient has increased pain with certain movements esp ER outside of neutral. Patient has reported improvement overall and has responded well to treatments thus far. Patient still has ongoing pain and weakness in right shoulder and unable to meet any further goals.    Rehab Potential Excellent   PT Frequency 3x / week   PT Duration 4 weeks   PT Treatment/Interventions ADLs/Self Care Home Management;Cryotherapy;Electrical Stimulation;Moist Heat;Ultrasound;Therapeutic activities;Therapeutic exercise;Patient/family education;Manual techniques   PT Next Visit Plan Continue with POC per MD. Gaspar Bidding [place]   Consulted and Agree with Plan of Care Patient      Patient will benefit from skilled therapeutic intervention in order to improve the following deficits and  impairments:  Pain, Decreased activity tolerance  Visit Diagnosis: Acute pain of right shoulder  Muscle weakness (generalized)     Problem List Patient Active Problem List   Diagnosis Date Noted  . Constipation 08/07/2016  . Hyperlipidemia 03/25/2011  . Seasonal allergies     Ladean Raya, PTA 12/03/16 8:25 AM [place] App[place] MPT [facility] [address] [place], [place], [address] Phone: [phone]   Fax:  [phone]  Name: Earl Martinez MRN: [id number] Date of Birth: 1961/03/05  PHYSICAL THERAPY DISCHARGE SUMMARY  Visits from Start of Care: 4.  Current functional level related to goals / functional outcomes: See above.   Remaining deficits: No goals met.   Education / Equipment: HEP. Plan: Patient agrees to discharge.  Patient goals were not met. Patient is being discharged due to lack of progress.  ?????         [place] Applegate MPT

## 2017-01-08 ENCOUNTER — Other Ambulatory Visit: Payer: Self-pay | Admitting: Pediatrics

## 2017-01-08 DIAGNOSIS — N400 Enlarged prostate without lower urinary tract symptoms: Secondary | ICD-10-CM

## 2017-02-07 ENCOUNTER — Other Ambulatory Visit: Payer: Self-pay | Admitting: Pediatrics

## 2017-02-07 DIAGNOSIS — N400 Enlarged prostate without lower urinary tract symptoms: Secondary | ICD-10-CM

## 2017-02-19 HISTORY — PX: ROTATOR CUFF REPAIR: SHX139

## 2017-02-26 ENCOUNTER — Ambulatory Visit: Payer: Managed Care, Other (non HMO) | Attending: Specialist | Admitting: Physical Therapy

## 2017-02-26 DIAGNOSIS — M25511 Pain in right shoulder: Secondary | ICD-10-CM | POA: Diagnosis present

## 2017-02-26 DIAGNOSIS — M6281 Muscle weakness (generalized): Secondary | ICD-10-CM | POA: Insufficient documentation

## 2017-02-26 DIAGNOSIS — M25611 Stiffness of right shoulder, not elsewhere classified: Secondary | ICD-10-CM | POA: Diagnosis present

## 2017-02-26 NOTE — Therapy (Signed)
[facility] [place], [place], [address] Phone: [phone]   Fax:  [phone]  Physical Therapy Evaluation  Patient Details  Name: ROEN MACGOWAN MRN: [id number] Date of Birth: 1961-10-19 Referring Provider: Hart Robinsons MD  Encounter Date: 02/26/2017      PT End of Session - 02/26/17 1256    Visit Number 5   Number of Visits 12   Date for PT Re-Evaluation 03/26/17   PT Start Time 1030   PT Stop Time 1123   PT Time Calculation (min) 53 min   Activity Tolerance Patient tolerated treatment well   Behavior During Therapy [facility] for tasks assessed/performed      Past Medical History:  Diagnosis Date  . Hyperlipemia   . Neck pain   . Rectal abscess   . Seasonal allergies     Past Surgical History:  Procedure Laterality Date  . BACK SURGERY    . NASAL SEPTUM SURGERY    . NECK SURGERY    . RECTAL SURGERY  09/19/11   rectal abscess    There were no vitals filed for this visit.       Subjective Assessment - 02/26/17 1250    Subjective The patient underwent a right shoulder SAD, DCR and RCR on 02/19/17.  She prsents to the clinic today with his sling with abduction pillow.  He is seeing his doctor today.  He is still in a lot of pain rated at close to 10/10 with movement out of sling.     Patient Stated Goals Use right arm normally again.   Pain Score 8    Pain Location Shoulder   Pain Orientation Right   Pain Descriptors / Indicators Aching;Dull;Discomfort;Throbbing   Pain Type Surgical pain   Pain Onset More than a month ago   Pain Frequency Constant   Aggravating Factors  See above.   Pain Relieving Factors Not moving shoulder.            [facility] PT Assessment - 02/26/17 0001      Assessment   Medical Diagnosis Right shoulder SAD;DCR and RCR.   Referring Provider Hart Robinsons MD   Onset Date/Surgical Date --  02/19/17(surgery date).     Precautions   Precaution Comments Begin with gentle right  shoulder PROM; PAROM.     Restrictions   Weight Bearing Restrictions No     Balance Screen   Has the patient fallen in the past 6 months No   Has the patient had a decrease in activity level because of a fear of falling?  No   Is the patient reluctant to leave their home because of a fear of falling?  No     Home Environment   Living Environment Private residence     Prior Function   Level of Independence Independent     Observation/Other Assessments   Observations Right shoulder stitiches still intact.     Posture/Postural Control   Posture Comments Guraded.     ROM / Strength   AROM / PROM / Strength PROM     PROM   Overall PROM Comments In supine:  Passive right shoulder flexion= 45 degrees and ER= 0 degrees.     Strength   Overall Strength Comments NT.     Palpation   Palpation comment Tender to palpation with even light palpation around right shoulder incisional site.                   [facility] Adult  PT Treatment/Exercise - 02/26/17 0001      Modalities   Modalities Cryotherapy;Electrical Stimulation     Cryotherapy   Number Minutes Cryotherapy 15 Minutes   Cryotherapy Location --  Right shoulder.   Type of Cryotherapy Ice pack     Electrical Stimulation   Electrical Stimulation Location Right shoulder.   Electrical Stimulation Action Constant pre-mod (non-motoric).     Manual Therapy   Manual Therapy Passive ROM   Manual therapy comments In supine:  Gentle passive left shoulder passiv range of motion into flexion and ER x 23 minutes.                     PT Long Term Goals - 02/26/17 1438      PT LONG TERM GOAL #1   Title Independent with a HEP.   Time 4   Period Weeks   Status New     PT LONG TERM GOAL #2   Title Active right shoulder flexion to 145 degrees so the patient can easily reach overhead   Time 4   Period Weeks   Status New     PT LONG TERM GOAL #3   Title Active ER to 70 degrees+ to allow for easily  donning/doffing of apparel   Time 4   Period Weeks   Status New     PT LONG TERM GOAL #4   Title Increase ROM so patient is able to reach behind back   Time 4   Period Weeks   Status New     PT LONG TERM GOAL #5   Title Perform ADL's with pain not > 3/10.   Time 4   Period Weeks   Status New               Plan - 02/26/17 1355    Clinical Impression Statement Patient s/p right shoulder SAD;DCR and RCR.  He is currently in a lot of pain and has significant loss of range of motion as expected.  Patient cannot perform ADL's at this time due to pain and limitations.  Patient will benefit from skilled physical therapy.      Patient will benefit from skilled therapeutic intervention in order to improve the following deficits and impairments:  Pain, Decreased activity tolerance, Decreased strength, Decreased range of motion  Visit Diagnosis: Acute pain of right shoulder - Plan: PT plan of care cert/re-cert  Stiffness of right shoulder, not elsewhere classified - Plan: PT plan of care cert/re-cert     Problem List Patient Active Problem List   Diagnosis Date Noted  . Constipation 08/07/2016  . Hyperlipidemia 03/25/2011  . Seasonal allergies     Jorgia Manthei, [name] 02/26/2017, 2:44 PM  [facility] [address] [place], [place], [address] Phone: [phone]   Fax:  [phone]  Name: OSMAN CALZADILLA MRN: [id number] Date of Birth: March 08, 1961

## 2017-02-27 ENCOUNTER — Ambulatory Visit: Payer: Managed Care, Other (non HMO) | Admitting: Physical Therapy

## 2017-02-27 DIAGNOSIS — M6281 Muscle weakness (generalized): Secondary | ICD-10-CM

## 2017-02-27 DIAGNOSIS — M25511 Pain in right shoulder: Secondary | ICD-10-CM

## 2017-02-27 DIAGNOSIS — M25611 Stiffness of right shoulder, not elsewhere classified: Secondary | ICD-10-CM

## 2017-02-27 NOTE — Patient Instructions (Signed)
Patient instructed in small circle pendulum exercise and passive cane exercise to increase ER and countertop stretch passively to increase flexion.

## 2017-02-27 NOTE — Therapy (Signed)
[facility] [place], [place], [address] Phone: [phone]   Fax:  [phone]  Physical Therapy Treatment  Patient Details  Name: Earl Martinez MRN: [id number] Date of Birth: April 03, 1961 Referring Provider: Hart Robinsons MD  Encounter Date: 02/27/2017      PT End of Session - 02/27/17 1336    Activity Tolerance Patient limited by pain   Behavior During Therapy [facility] for tasks assessed/performed      Past Medical History:  Diagnosis Date  . Hyperlipemia   . Neck pain   . Rectal abscess   . Seasonal allergies     Past Surgical History:  Procedure Laterality Date  . BACK SURGERY    . NASAL SEPTUM SURGERY    . NECK SURGERY    . RECTAL SURGERY  09/19/11   rectal abscess    There were no vitals filed for this visit.      Subjective Assessment - 02/27/17 1333    Subjective I'm still hurting a lot.                         [place] Adult PT Treatment/Exercise - 02/27/17 0001      Exercises   Exercises Shoulder     Shoulder Exercises: Standing   Other Standing Exercises Patient performed supine cane ER to increase right shoulder ER passively x 5 minutes and countertop stretch performed passively to increase flexion x 5 minutes.     Modalities   Modalities Vasopneumatic     Acupuncturist Location RT SHLD.   Electrical Stimulation Action Non-motoric constant pre-mod.   Electrical Stimulation Parameters 80-150 Hz x 15 minutes.   Electrical Stimulation Goals Edema     Vasopneumatic   Number Minutes Vasopneumatic  15 minutes   Vasopnuematic Location  --  Right shoulder.   Vasopneumatic Pressure Medium     Manual Therapy   Manual Therapy Passive ROM   Manual therapy comments In supine:  Gentle right shoulder flexion and ER x 23 minutres.                PT Education - 02/27/17 1341    Education provided Yes   Education Details HEP.   Person(s)  Educated Patient   Methods Explanation;Demonstration;Tactile cues;Verbal cues;Handout   Comprehension Verbalized understanding;Returned demonstration;Verbal cues required;Tactile cues required;Need further instruction             PT Long Term Goals - 02/26/17 1438      PT LONG TERM GOAL #1   Title Independent with a HEP.   Time 4   Period Weeks   Status New     PT LONG TERM GOAL #2   Title Active right shoulder flexion to 145 degrees so the patient can easily reach overhead   Time 4   Period Weeks   Status New     PT LONG TERM GOAL #3   Title Active ER to 70 degrees+ to allow for easily donning/doffing of apparel   Time 4   Period Weeks   Status New     PT LONG TERM GOAL #4   Title Increase ROM so patient is able to reach behind back   Time 4   Period Weeks   Status New     PT LONG TERM GOAL #5   Title Perform ADL's with pain not > 3/10.   Time 4   Period Weeks   Status New  Plan - 02/27/17 1336    Clinical Impression Statement Patient still in a lot of pain.  Gentle passive of range of motion performed today due to high pain-level.      Patient will benefit from skilled therapeutic intervention in order to improve the following deficits and impairments:  Pain, Decreased activity tolerance, Decreased range of motion  Visit Diagnosis: Acute pain of right shoulder  Stiffness of right shoulder, not elsewhere classified  Muscle weakness (generalized)     Problem List Patient Active Problem List   Diagnosis Date Noted  . Constipation 08/07/2016  . Hyperlipidemia 03/25/2011  . Seasonal allergies     Earl Martinez, [name] 02/27/2017, 1:43 PM  [facility] [address] [place], [place], [address] Phone: [phone]   Fax:  [phone]  Name: Earl Martinez MRN: [id number] Date of Birth: 1961-05-26

## 2017-03-02 ENCOUNTER — Ambulatory Visit: Payer: Managed Care, Other (non HMO) | Admitting: Physical Therapy

## 2017-03-02 ENCOUNTER — Encounter: Payer: Self-pay | Admitting: Physical Therapy

## 2017-03-02 DIAGNOSIS — M25511 Pain in right shoulder: Secondary | ICD-10-CM

## 2017-03-02 DIAGNOSIS — M25611 Stiffness of right shoulder, not elsewhere classified: Secondary | ICD-10-CM

## 2017-03-02 DIAGNOSIS — M6281 Muscle weakness (generalized): Secondary | ICD-10-CM

## 2017-03-02 NOTE — Therapy (Signed)
[facility] [place], [place], [address] Phone: [phone]   Fax:  [phone]  Physical Therapy Treatment  Patient Details  Name: Earl Martinez MRN: [id number] Date of Birth: 1961-02-22 Referring Provider: Hart Robinsons MD  Encounter Date: 03/02/2017      PT End of Session - 03/02/17 1440    Visit Number 7   Number of Visits 12   Date for PT Re-Evaluation 03/26/17   PT Start Time 1287   PT Stop Time 1455   PT Time Calculation (min) 56 min   Activity Tolerance Patient tolerated treatment well;Patient limited by pain   Behavior During Therapy [facility] for tasks assessed/performed      Past Medical History:  Diagnosis Date  . Hyperlipemia   . Neck pain   . Rectal abscess   . Seasonal allergies     Past Surgical History:  Procedure Laterality Date  . BACK SURGERY    . NASAL SEPTUM SURGERY    . NECK SURGERY    . RECTAL SURGERY  09/19/11   rectal abscess    There were no vitals filed for this visit.      Subjective Assessment - 03/02/17 1404    Subjective Patient doing well and doing self PROM as instructed per MPT   Patient Stated Goals Use right arm normally again.   Currently in Pain? Yes   Pain Score 4    Pain Location Shoulder   Pain Orientation Right   Pain Descriptors / Indicators Aching;Sore   Pain Type Surgical pain   Pain Onset More than a month ago   Pain Frequency Constant   Aggravating Factors  movement   Pain Relieving Factors at rest            [facility] PT Assessment - 03/02/17 0001      ROM / Strength   AROM / PROM / Strength PROM     PROM   PROM Assessment Site Shoulder   Right/Left Shoulder Right   Right Shoulder External Rotation 30 Degrees                     [facility] Adult PT Treatment/Exercise - 03/02/17 0001      Electrical Stimulation   Electrical Stimulation Location RT SHLD.   Electrical Stimulation Action premod   Printmaker Parameters  1-10hz  x27min   Electrical Stimulation Goals Edema     Vasopneumatic   Number Minutes Vasopneumatic  15 minutes   Vasopnuematic Location  Shoulder   Vasopneumatic Pressure Medium     Manual Therapy   Manual Therapy Passive ROM   Manual therapy comments In supine:  Gentle right shoulder flexion and ER                      PT Long Term Goals - 02/26/17 1438      PT LONG TERM GOAL #1   Title Independent with a HEP.   Time 4   Period Weeks   Status New     PT LONG TERM GOAL #2   Title Active right shoulder flexion to 145 degrees so the patient can easily reach overhead   Time 4   Period Weeks   Status New     PT LONG TERM GOAL #3   Title Active ER to 70 degrees+ to allow for easily donning/doffing of apparel   Time 4   Period Weeks   Status New     PT LONG TERM  GOAL #4   Title Increase ROM so patient is able to reach behind back   Time 4   Period Weeks   Status New     PT LONG TERM GOAL #5   Title Perform ADL's with pain not > 3/10.   Time 4   Period Weeks   Status New               Plan - 03/02/17 1441    Clinical Impression Statement Patient tolerated treatment fairly well and some limitation due to pain level and guarding. Patient doing HEP per MD. Theda Sers. Patient progressing toward goals yet ongoing due to protocol limitations. Educated patient on self PROM table stretch for flexion per MPT today.   Rehab Potential Excellent   Clinical Impairments Affecting Rehab Potential surgury 02/19/17 / current 2 weeks 03/05/17   PT Frequency 3x / week   PT Duration 4 weeks   PT Treatment/Interventions ADLs/Self Care Home Management;Cryotherapy;Electrical Stimulation;Ultrasound;Moist Heat;Therapeutic activities;Therapeutic exercise;Neuromuscular re-education;Patient/family education;Passive range of motion;Manual techniques;Vasopneumatic Device   PT Next Visit Plan PROM first 4 weeks per protocol (see media tab).  Modalites PRN.   Consulted and Agree  with Plan of Care Patient      Patient will benefit from skilled therapeutic intervention in order to improve the following deficits and impairments:  Pain, Decreased activity tolerance, Decreased range of motion  Visit Diagnosis: Acute pain of right shoulder  Stiffness of right shoulder, not elsewhere classified  Muscle weakness (generalized)     Problem List Patient Active Problem List   Diagnosis Date Noted  . Constipation 08/07/2016  . Hyperlipidemia 03/25/2011  . Seasonal allergies     Leaman Abe P, PTA 03/02/2017, 2:58 PM  [facility] [address] [place], [place], [address] Phone: [phone]   Fax:  [phone]  Name: Earl Martinez MRN: [id number] Date of Birth: 03/13/1961

## 2017-03-05 ENCOUNTER — Ambulatory Visit: Payer: Managed Care, Other (non HMO) | Admitting: *Deleted

## 2017-03-05 DIAGNOSIS — M6281 Muscle weakness (generalized): Secondary | ICD-10-CM

## 2017-03-05 DIAGNOSIS — M25511 Pain in right shoulder: Secondary | ICD-10-CM

## 2017-03-05 DIAGNOSIS — M25611 Stiffness of right shoulder, not elsewhere classified: Secondary | ICD-10-CM

## 2017-03-05 NOTE — Therapy (Signed)
[facility] [place], [place], [address] Phone: [phone]   Fax:  [phone]  Physical Therapy Treatment  Patient Details  Name: Earl Martinez MRN: [id number] Date of Birth: 04-Apr-1961 Referring Provider: Hart Robinsons MD  Encounter Date: 03/05/2017      PT End of Session - 03/05/17 1519    Visit Number 8   Number of Visits 12   Date for PT Re-Evaluation 03/26/17   PT Start Time 2952   PT Stop Time 1605   PT Time Calculation (min) 50 min      Past Medical History:  Diagnosis Date  . Hyperlipemia   . Neck pain   . Rectal abscess   . Seasonal allergies     Past Surgical History:  Procedure Laterality Date  . BACK SURGERY    . NASAL SEPTUM SURGERY    . NECK SURGERY    . RECTAL SURGERY  09/19/11   rectal abscess    There were no vitals filed for this visit.      Subjective Assessment - 03/05/17 1518    Subjective Patient doing well and doing self PROM as instructed per MPT   Patient Stated Goals Use right arm normally again.   Currently in Pain? Yes   Pain Score 4    Pain Location Shoulder   Pain Orientation Right   Pain Descriptors / Indicators Aching;Sore   Pain Onset More than a month ago                         [facility] Adult PT Treatment/Exercise - 03/05/17 0001      Exercises   Exercises Shoulder     Electrical Stimulation   Electrical Stimulation Location Right shoulder. Premod x 15 mins 80-150hz    Electrical Stimulation Goals Edema     Vasopneumatic   Number Minutes Vasopneumatic  15 minutes   Vasopnuematic Location  Shoulder   Vasopneumatic Pressure Medium   Vasopneumatic Temperature  38     Manual Therapy   Manual Therapy Passive ROM   Manual therapy comments In supine:  Gentle right shoulder flexion and ER with end range holds. Verbal cues to relax                     PT Long Term Goals - 02/26/17 1438      PT LONG TERM GOAL #1   Title  Independent with a HEP.   Time 4   Period Weeks   Status New     PT LONG TERM GOAL #2   Title Active right shoulder flexion to 145 degrees so the patient can easily reach overhead   Time 4   Period Weeks   Status New     PT LONG TERM GOAL #3   Title Active ER to 70 degrees+ to allow for easily donning/doffing of apparel   Time 4   Period Weeks   Status New     PT LONG TERM GOAL #4   Title Increase ROM so patient is able to reach behind back   Time 4   Period Weeks   Status New     PT LONG TERM GOAL #5   Title Perform ADL's with pain not > 3/10.   Time 4   Period Weeks   Status New               Plan - 03/05/17 1605    Clinical Impression Statement Pt  did fair today with Rx. He needs constant VCs to relax during PROM. End of Rx he was able to reach flexion of 95 degrees and er to 35 degrees. Pt is about 2 weeks post-op. LTGs are ongoing   Rehab Potential Excellent   Clinical Impairments Affecting Rehab Potential surgury 02/19/17 / current 2 weeks 03/05/17   PT Frequency 3x / week   PT Duration 4 weeks   PT Treatment/Interventions ADLs/Self Care Home Management;Cryotherapy;Electrical Stimulation;Ultrasound;Moist Heat;Therapeutic activities;Therapeutic exercise;Neuromuscular re-education;Patient/family education;Passive range of motion;Manual techniques;Vasopneumatic Device   PT Next Visit Plan PROM first 4 weeks per protocol (see media tab).  Modalites PRN.   Consulted and Agree with Plan of Care Patient      Patient will benefit from skilled therapeutic intervention in order to improve the following deficits and impairments:  Pain, Decreased activity tolerance, Decreased range of motion  Visit Diagnosis: Acute pain of right shoulder  Stiffness of right shoulder, not elsewhere classified  Muscle weakness (generalized)     Problem List Patient Active Problem List   Diagnosis Date Noted  . Constipation 08/07/2016  . Hyperlipidemia 03/25/2011  . Seasonal  allergies     Pairlee Sawtell,CHRIS, PTA 03/05/2017, 4:11 PM  [facility] [address] [place], [place], [address] Phone: [phone]   Fax:  [phone]  Name: Earl Martinez MRN: [id number] Date of Birth: 1961/07/19

## 2017-03-06 ENCOUNTER — Other Ambulatory Visit: Payer: Self-pay | Admitting: Pediatrics

## 2017-03-09 ENCOUNTER — Ambulatory Visit: Payer: Managed Care, Other (non HMO) | Attending: Specialist | Admitting: Physical Therapy

## 2017-03-09 ENCOUNTER — Encounter: Payer: Self-pay | Admitting: Physical Therapy

## 2017-03-09 DIAGNOSIS — M6281 Muscle weakness (generalized): Secondary | ICD-10-CM | POA: Diagnosis present

## 2017-03-09 DIAGNOSIS — M25611 Stiffness of right shoulder, not elsewhere classified: Secondary | ICD-10-CM

## 2017-03-09 DIAGNOSIS — M25511 Pain in right shoulder: Secondary | ICD-10-CM

## 2017-03-09 NOTE — Therapy (Signed)
[facility] [place], [place], [address] Phone: [phone]   Fax:  [phone]  Physical Therapy Treatment  Patient Details  Name: IMANI SHERRIN MRN: [id number] Date of Birth: 07/31/1961 Referring Provider: Hart Robinsons MD  Encounter Date: 03/09/2017      PT End of Session - 03/09/17 1518    Visit Number 9   Number of Visits 12   Date for PT Re-Evaluation 03/26/17   PT Start Time 4270   PT Stop Time 1530   PT Time Calculation (min) 46 min   Activity Tolerance Patient tolerated treatment well;Patient limited by pain   Behavior During Therapy [facility] for tasks assessed/performed      Past Medical History:  Diagnosis Date  . Hyperlipemia   . Neck pain   . Rectal abscess   . Seasonal allergies     Past Surgical History:  Procedure Laterality Date  . BACK SURGERY    . NASAL SEPTUM SURGERY    . NECK SURGERY    . RECTAL SURGERY  09/19/11   rectal abscess    There were no vitals filed for this visit.      Subjective Assessment - 03/09/17 1451    Subjective Patient reported increased soreness for unknown reason   Patient Stated Goals Use right arm normally again.   Currently in Pain? Yes   Pain Score 4    Pain Location Shoulder   Pain Orientation Right   Pain Descriptors / Indicators Aching;Sore   Pain Type Surgical pain   Pain Onset More than a month ago   Pain Frequency Constant   Aggravating Factors  movement   Pain Relieving Factors at rest            [facility] PT Assessment - 03/09/17 0001      PROM   PROM Assessment Site Shoulder   Right/Left Shoulder Right   Right Shoulder Flexion 96 Degrees   Right Shoulder External Rotation 35 Degrees                     [facility] Adult PT Treatment/Exercise - 03/09/17 0001      Electrical Stimulation   Electrical Stimulation Location Right shoulder. Premod x 15 mins 80-150hz    Electrical Stimulation Goals Edema;Pain     Vasopneumatic   Number Minutes Vasopneumatic  15 minutes   Vasopnuematic Location  Shoulder   Vasopneumatic Pressure Medium     Manual Therapy   Manual Therapy Passive ROM   Manual therapy comments gentle PROM for right shoulder flexion/ER/IR with holds                     PT Long Term Goals - 03/09/17 1519      PT LONG TERM GOAL #1   Title Independent with a HEP.   Time 4   Period Weeks   Status On-going     PT LONG TERM GOAL #2   Title Active right shoulder flexion to 145 degrees so the patient can easily reach overhead   Time 4   Period Weeks   Status On-going     PT LONG TERM GOAL #3   Title Active ER to 70 degrees+ to allow for easily donning/doffing of apparel   Time 4   Period Weeks   Status On-going     PT LONG TERM GOAL #4   Title Increase ROM so patient is able to reach behind back   Time 4   Period Weeks  Status On-going     PT LONG TERM GOAL #5   Title Perform ADL's with pain not > 3/10.   Time 4   Period Weeks   Status On-going               Plan - 03/09/17 1519    Clinical Impression Statement Patient tolerated treatment well today yet had increased guarding and required ossilations to relax. Patient continues to improve PROM for flexion and ER. Patient current goals ongoing due to protocol/healing. Reviewed HEP for wand IR/ER per protocol   Rehab Potential Excellent   Clinical Impairments Affecting Rehab Potential surgury 02/19/17 / current 3 weeks 03/12/17   PT Frequency 3x / week   PT Duration 4 weeks   PT Treatment/Interventions ADLs/Self Care Home Management;Cryotherapy;Electrical Stimulation;Ultrasound;Moist Heat;Therapeutic activities;Therapeutic exercise;Neuromuscular re-education;Patient/family education;Passive range of motion;Manual techniques;Vasopneumatic Device   PT Next Visit Plan PROM first 4 weeks per protocol (see media tab).  Modalites PRN. (MD. collins next monday)   Consulted and Agree with Plan of Care Patient      Patient  will benefit from skilled therapeutic intervention in order to improve the following deficits and impairments:  Pain, Decreased activity tolerance, Decreased range of motion  Visit Diagnosis: Acute pain of right shoulder  Stiffness of right shoulder, not elsewhere classified  Muscle weakness (generalized)     Problem List Patient Active Problem List   Diagnosis Date Noted  . Constipation 08/07/2016  . Hyperlipidemia 03/25/2011  . Seasonal allergies     DUNFORD, CHRISTINA P, PTA 03/09/2017, 3:32 PM  [facility] [place], [place], [address] Phone: [phone]   Fax:  [phone]  Name: ADIL TUGWELL MRN: [id number] Date of Birth: 25-Sep-1961

## 2017-03-11 ENCOUNTER — Ambulatory Visit: Payer: Managed Care, Other (non HMO) | Admitting: Physical Therapy

## 2017-03-11 ENCOUNTER — Encounter: Payer: Self-pay | Admitting: Physical Therapy

## 2017-03-11 ENCOUNTER — Other Ambulatory Visit: Payer: Self-pay | Admitting: Pediatrics

## 2017-03-11 DIAGNOSIS — M6281 Muscle weakness (generalized): Secondary | ICD-10-CM

## 2017-03-11 DIAGNOSIS — M25611 Stiffness of right shoulder, not elsewhere classified: Secondary | ICD-10-CM

## 2017-03-11 DIAGNOSIS — M25511 Pain in right shoulder: Secondary | ICD-10-CM | POA: Diagnosis not present

## 2017-03-11 NOTE — Therapy (Signed)
[facility] [place], [place], [address] Phone: [phone]   Fax:  [phone]  Physical Therapy Treatment  Patient Details  Name: AKBAR SACRA MRN: [id number] Date of Birth: [birth year] Referring Provider: Hart Robinsons MD  Encounter Date: 03/11/2017      PT End of Session - 03/11/17 1513    Visit Number 10   Number of Visits 12   Date for PT Re-Evaluation 03/26/17   PT Start Time 9628   PT Stop Time 1522   PT Time Calculation (min) 50 min   Activity Tolerance Patient tolerated treatment well   Behavior During Therapy [facility] for tasks assessed/performed      Past Medical History:  Diagnosis Date  . Hyperlipemia   . Neck pain   . Rectal abscess   . Seasonal allergies     Past Surgical History:  Procedure Laterality Date  . BACK SURGERY    . NASAL SEPTUM SURGERY    . NECK SURGERY    . RECTAL SURGERY  09/19/11   rectal abscess    There were no vitals filed for this visit.      Subjective Assessment - 03/11/17 1446    Subjective Patient doing well today ongoing sore and stiffness noted   Patient Stated Goals Use right arm normally again.   Currently in Pain? Yes   Pain Score 4    Pain Location Shoulder   Pain Orientation Right   Pain Descriptors / Indicators Aching;Sore   Pain Type Surgical pain   Pain Onset More than a month ago   Pain Frequency Constant   Aggravating Factors  movement   Pain Relieving Factors at rest            [facility] PT Assessment - 03/11/17 0001      PROM   PROM Assessment Site Shoulder   Right/Left Shoulder Right   Right Shoulder External Rotation 44 Degrees                     [facility] Adult PT Treatment/Exercise - 03/11/17 0001      Electrical Stimulation   Electrical Stimulation Location Right shoulder. Premod x 15 mins 80-150hz    Electrical Stimulation Goals Edema;Pain     Vasopneumatic   Number Minutes Vasopneumatic  15 minutes   Vasopnuematic  Location  Shoulder   Vasopneumatic Pressure Medium     Manual Therapy   Manual Therapy Passive ROM   Manual therapy comments gentle PROM for right shoulder flexion/ER/IR with holds                     PT Long Term Goals - 03/09/17 1519      PT LONG TERM GOAL #1   Title Independent with a HEP.   Time 4   Period Weeks   Status On-going     PT LONG TERM GOAL #2   Title Active right shoulder flexion to 145 degrees so the patient can easily reach overhead   Time 4   Period Weeks   Status On-going     PT LONG TERM GOAL #3   Title Active ER to 70 degrees+ to allow for easily donning/doffing of apparel   Time 4   Period Weeks   Status On-going     PT LONG TERM GOAL #4   Title Increase ROM so patient is able to reach behind back   Time 4   Period Weeks   Status On-going  PT LONG TERM GOAL #5   Title Perform ADL's with pain not > 3/10.   Time 4   Period Weeks   Status On-going               Plan - 03/11/17 1515    Clinical Impression Statement Patient tolerated treatment well today. Patient continues to progresss with PROM. Patient requires ossilations and cues to relax with guarding. Patient goals ongoing due to protocol limitations.    Rehab Potential Excellent   Clinical Impairments Affecting Rehab Potential surgury 02/19/17 / current 3 weeks 03/12/17   PT Frequency 3x / week   PT Duration 4 weeks   PT Treatment/Interventions ADLs/Self Care Home Management;Cryotherapy;Electrical Stimulation;Ultrasound;Moist Heat;Therapeutic activities;Therapeutic exercise;Neuromuscular re-education;Patient/family education;Passive range of motion;Manual techniques;Vasopneumatic Device   PT Next Visit Plan PROM first 4 weeks per protocol (see media tab).  Modalites PRN. (MD. collins next monday)   Consulted and Agree with Plan of Care Patient      Patient will benefit from skilled therapeutic intervention in order to improve the following deficits and impairments:   Pain, Decreased activity tolerance, Decreased range of motion  Visit Diagnosis: Acute pain of right shoulder  Muscle weakness (generalized)  Stiffness of right shoulder, not elsewhere classified     Problem List Patient Active Problem List   Diagnosis Date Noted  . Constipation 08/07/2016  . Hyperlipidemia 03/25/2011  . Seasonal allergies     Ramaj Frangos P, PTA 03/11/2017, 3:22 PM  [facility] [address] [place], [place], [address] Phone: [phone]   Fax:  [phone]  Name: JAEQUAN PROPES MRN: [id number] Date of Birth: 1961-01-12

## 2017-03-12 ENCOUNTER — Encounter: Payer: Self-pay | Admitting: Physical Therapy

## 2017-03-12 ENCOUNTER — Ambulatory Visit: Payer: Managed Care, Other (non HMO) | Admitting: Physical Therapy

## 2017-03-12 DIAGNOSIS — M25611 Stiffness of right shoulder, not elsewhere classified: Secondary | ICD-10-CM

## 2017-03-12 DIAGNOSIS — M25511 Pain in right shoulder: Secondary | ICD-10-CM | POA: Diagnosis not present

## 2017-03-12 DIAGNOSIS — M6281 Muscle weakness (generalized): Secondary | ICD-10-CM

## 2017-03-12 NOTE — Therapy (Signed)
[facility] [place], [place], [address] Phone: [phone]   Fax:  [phone]  Physical Therapy Treatment  Patient Details  Name: Earl Martinez MRN: [id number] Date of Birth: 1961-09-08 Referring Provider: Hart Robinsons MD  Encounter Date: 03/12/2017      PT End of Session - 03/12/17 1516    Visit Number 11   Number of Visits 12   Date for PT Re-Evaluation 03/26/17   PT Start Time 5456   PT Stop Time 1526   PT Time Calculation (min) 45 min   Activity Tolerance Patient tolerated treatment well   Behavior During Therapy [facility] for tasks assessed/performed      Past Medical History:  Diagnosis Date  . Hyperlipemia   . Neck pain   . Rectal abscess   . Seasonal allergies     Past Surgical History:  Procedure Laterality Date  . BACK SURGERY    . NASAL SEPTUM SURGERY    . NECK SURGERY    . RECTAL SURGERY  09/19/11   rectal abscess    There were no vitals filed for this visit.      Subjective Assessment - 03/12/17 1443    Subjective Patient reported some soreness yet ok overall   Patient Stated Goals Use right arm normally again.   Currently in Pain? Yes   Pain Score 4    Pain Location Shoulder   Pain Orientation Right   Pain Descriptors / Indicators Aching;Sore   Pain Type Surgical pain   Pain Onset More than a month ago   Pain Frequency Constant   Aggravating Factors  movement   Pain Relieving Factors rest            [facility] PT Assessment - 03/12/17 0001      PROM   PROM Assessment Site Shoulder   Right/Left Shoulder Right   Right Shoulder Flexion 97 Degrees   Right Shoulder External Rotation 45 Degrees                     [facility] Adult PT Treatment/Exercise - 03/12/17 0001      Electrical Stimulation   Electrical Stimulation Location Right shoulder. Premod x 15 mins 80-150hz    Electrical Stimulation Goals Edema;Pain     Vasopneumatic   Number Minutes Vasopneumatic  15 minutes    Vasopnuematic Location  Shoulder   Vasopneumatic Pressure Medium     Manual Therapy   Manual Therapy Passive ROM   Manual therapy comments gentle PROM for right shoulder flexion/ER/IR with holds                     PT Long Term Goals - 03/09/17 1519      PT LONG TERM GOAL #1   Title Independent with a HEP.   Time 4   Period Weeks   Status On-going     PT LONG TERM GOAL #2   Title Active right shoulder flexion to 145 degrees so the patient can easily reach overhead   Time 4   Period Weeks   Status On-going     PT LONG TERM GOAL #3   Title Active ER to 70 degrees+ to allow for easily donning/doffing of apparel   Time 4   Period Weeks   Status On-going     PT LONG TERM GOAL #4   Title Increase ROM so patient is able to reach behind back   Time 4   Period Weeks   Status  On-going     PT LONG TERM GOAL #5   Title Perform ADL's with pain not > 3/10.   Time 4   Period Weeks   Status On-going               Plan - 03/12/17 1518    Clinical Impression Statement Patient tolerated treatment well today. Patient continues to progress with PROM and requires cues and ossilations to relax due to guarding. Patient progressing toward goals yet ongoing due to protocol/healing limitations.   Rehab Potential Excellent   Clinical Impairments Affecting Rehab Potential surgury 02/19/17 / current 3 weeks 03/12/17   PT Frequency 3x / week   PT Duration 4 weeks   PT Treatment/Interventions ADLs/Self Care Home Management;Cryotherapy;Electrical Stimulation;Ultrasound;Moist Heat;Therapeutic activities;Therapeutic exercise;Neuromuscular re-education;Patient/family education;Passive range of motion;Manual techniques;Vasopneumatic Device   PT Next Visit Plan PROM first 4 weeks per protocol (see media tab).  Modalites PRN. (MD. collins next monday)   Consulted and Agree with Plan of Care Patient      Patient will benefit from skilled therapeutic intervention in order to improve  the following deficits and impairments:  Pain, Decreased activity tolerance, Decreased range of motion  Visit Diagnosis: Acute pain of right shoulder  Muscle weakness (generalized)  Stiffness of right shoulder, not elsewhere classified     Problem List Patient Active Problem List   Diagnosis Date Noted  . Constipation 08/07/2016  . Hyperlipidemia 03/25/2011  . Seasonal allergies     Heily Carlucci P, PTA 03/12/2017, 3:27 PM  [facility] [address] [place], [place], [address] Phone: [phone]   Fax:  [phone]  Name: Earl Martinez MRN: [id number] Date of Birth: Mar 21, 1961

## 2017-03-16 ENCOUNTER — Encounter: Payer: Self-pay | Admitting: Physical Therapy

## 2017-03-16 ENCOUNTER — Ambulatory Visit: Payer: Managed Care, Other (non HMO) | Admitting: Physical Therapy

## 2017-03-16 DIAGNOSIS — M25611 Stiffness of right shoulder, not elsewhere classified: Secondary | ICD-10-CM

## 2017-03-16 DIAGNOSIS — M25511 Pain in right shoulder: Secondary | ICD-10-CM | POA: Diagnosis not present

## 2017-03-16 DIAGNOSIS — M6281 Muscle weakness (generalized): Secondary | ICD-10-CM

## 2017-03-16 NOTE — Therapy (Signed)
[facility] [place], [place], [address] Phone: [phone]   Fax:  [phone]  Physical Therapy Treatment  Patient Details  Name: Earl Martinez MRN: [id number] Date of Birth: [birth year] Referring Provider: Hart Robinsons MD  Encounter Date: 03/16/2017      PT End of Session - 03/16/17 1254    Visit Number 12   Number of Visits 12   Date for PT Re-Evaluation 03/26/17   PT Start Time 9163   PT Stop Time 1321   PT Time Calculation (min) 50 min   Activity Tolerance Patient tolerated treatment well   Behavior During Therapy [facility] for tasks assessed/performed      Past Medical History:  Diagnosis Date  . Hyperlipemia   . Neck pain   . Rectal abscess   . Seasonal allergies     Past Surgical History:  Procedure Laterality Date  . BACK SURGERY    . NASAL SEPTUM SURGERY    . NECK SURGERY    . RECTAL SURGERY  09/19/11   rectal abscess    There were no vitals filed for this visit.      Subjective Assessment - 03/16/17 1233    Subjective Patient reported increased pain over weekend for unknown reason , better today   Patient Stated Goals Use right arm normally again.   Currently in Pain? Yes   Pain Score 4    Pain Location Shoulder   Pain Orientation Right   Pain Descriptors / Indicators Aching;Sore   Pain Type Surgical pain   Pain Onset More than a month ago   Pain Frequency Constant   Aggravating Factors  movement   Pain Relieving Factors rest            [facility] PT Assessment - 03/16/17 0001      PROM   PROM Assessment Site Shoulder   Right/Left Shoulder Right   Right Shoulder Flexion 106 Degrees   Right Shoulder External Rotation 45 Degrees                     [facility] Adult PT Treatment/Exercise - 03/16/17 0001      Electrical Stimulation   Electrical Stimulation Location Right shoulder. Premod x 15 mins 80-150hz    Electrical Stimulation Goals Edema;Pain     Vasopneumatic   Number  Minutes Vasopneumatic  15 minutes   Vasopnuematic Location  Shoulder   Vasopneumatic Pressure Medium     Manual Therapy   Manual Therapy Passive ROM   Manual therapy comments gentle PROM for right shoulder flexion/ER/IR with holds                     PT Long Term Goals - 03/09/17 1519      PT LONG TERM GOAL #1   Title Independent with a HEP.   Time 4   Period Weeks   Status On-going     PT LONG TERM GOAL #2   Title Active right shoulder flexion to 145 degrees so the patient can easily reach overhead   Time 4   Period Weeks   Status On-going     PT LONG TERM GOAL #3   Title Active ER to 70 degrees+ to allow for easily donning/doffing of apparel   Time 4   Period Weeks   Status On-going     PT LONG TERM GOAL #4   Title Increase ROM so patient is able to reach behind back   Time 4  Period Weeks   Status On-going     PT LONG TERM GOAL #5   Title Perform ADL's with pain not > 3/10.   Time 4   Period Weeks   Status On-going               Plan - 03/16/17 1306    Clinical Impression Statement Patient tolerated treatment well today. Patient continues to progress with PROM. Patient requires ossilations and cues to relax due to guarding. Patient doing HEP as instructed by MD/MPT. Patient current goals progressing yet ongoing due to healing/protocol.    Rehab Potential Excellent   Clinical Impairments Affecting Rehab Potential surgury 02/19/17 / current 3 weeks 03/12/17   PT Frequency 3x / week   PT Duration 4 weeks   PT Treatment/Interventions ADLs/Self Care Home Management;Cryotherapy;Electrical Stimulation;Ultrasound;Moist Heat;Therapeutic activities;Therapeutic exercise;Neuromuscular re-education;Patient/family education;Passive range of motion;Manual techniques;Vasopneumatic Device   PT Next Visit Plan PROM first 4 weeks per protocol (see media tab).  Modalites PRN. (MD. Theda Sers 03/16/17)   Consulted and Agree with Plan of Care Patient      Patient  will benefit from skilled therapeutic intervention in order to improve the following deficits and impairments:  Pain, Decreased activity tolerance, Decreased range of motion  Visit Diagnosis: Acute pain of right shoulder  Muscle weakness (generalized)  Stiffness of right shoulder, not elsewhere classified     Problem List Patient Active Problem List   Diagnosis Date Noted  . Constipation 08/07/2016  . Hyperlipidemia 03/25/2011  . Seasonal allergies    Ladean Raya, PTA 03/16/17 5:53 PM Mali Applegate MPT [facility] [address] [place], [place], [address] Phone: [phone]   Fax:  [phone]  Name: Earl Martinez MRN: [id number] Date of Birth: 1961-05-29

## 2017-03-19 ENCOUNTER — Ambulatory Visit: Payer: Managed Care, Other (non HMO) | Admitting: *Deleted

## 2017-03-19 DIAGNOSIS — M25511 Pain in right shoulder: Secondary | ICD-10-CM | POA: Diagnosis not present

## 2017-03-19 DIAGNOSIS — M6281 Muscle weakness (generalized): Secondary | ICD-10-CM

## 2017-03-19 DIAGNOSIS — M25611 Stiffness of right shoulder, not elsewhere classified: Secondary | ICD-10-CM

## 2017-03-19 NOTE — Therapy (Signed)
[facility] [place], [place], [address] Phone: [phone]   Fax:  [phone]  Physical Therapy Treatment  Patient Details  Name: Earl Martinez MRN: [id number] Date of Birth: Apr 08, 1961 Referring Provider: Hart Robinsons MD  Encounter Date: 03/19/2017    Past Medical History:  Diagnosis Date  . Hyperlipemia   . Neck pain   . Rectal abscess   . Seasonal allergies     Past Surgical History:  Procedure Laterality Date  . BACK SURGERY    . NASAL SEPTUM SURGERY    . NECK SURGERY    . RECTAL SURGERY  09/19/11   rectal abscess    There were no vitals filed for this visit.      Subjective Assessment - 03/19/17 1352    Subjective Went to MD Monday and he was pleased with status. today is 4 weeks post-op   Patient Stated Goals Use right arm normally again.   Currently in Pain? Yes   Pain Score 3    Pain Location Shoulder   Pain Orientation Right   Pain Descriptors / Indicators Aching   Pain Type Surgical pain   Pain Onset More than a month ago   Pain Frequency Constant                         [facility] Adult PT Treatment/Exercise - 03/19/17 0001      Moist Heat Therapy   Number Minutes Moist Heat 15 Minutes   Moist Heat Location Shoulder     Electrical Stimulation   Electrical Stimulation Location Right shoulder. Premod x 15 mins 80-150hz    Electrical Stimulation Goals Edema;Pain     Vasopneumatic   Number Minutes Vasopneumatic  --   Vasopnuematic Location  --   Vasopneumatic Pressure --   Vasopneumatic Temperature  --     Manual Therapy   Manual Therapy Passive ROM   Manual therapy comments gentle PROM for right shoulder flexion/ER/IR with holds                     PT Long Term Goals - 03/09/17 1519      PT LONG TERM GOAL #1   Title Independent with a HEP.   Time 4   Period Weeks   Status On-going     PT LONG TERM GOAL #2   Title Active right shoulder flexion to  145 degrees so the patient can easily reach overhead   Time 4   Period Weeks   Status On-going     PT LONG TERM GOAL #3   Title Active ER to 70 degrees+ to allow for easily donning/doffing of apparel   Time 4   Period Weeks   Status On-going     PT LONG TERM GOAL #4   Title Increase ROM so patient is able to reach behind back   Time 4   Period Weeks   Status On-going     PT LONG TERM GOAL #5   Title Perform ADL's with pain not > 3/10.   Time 4   Period Weeks   Status On-going               Plan - 03/19/17 1535    Clinical Impression Statement Pt returns today after F/U with MD. He states MD was pleased with ROM at 4 weeks.Pt  did fairly well today with PROM to RT shldr , but still tight in flexion and ER with ER  having the greatest deficit. LTGs are ongoing   Clinical Impairments Affecting Rehab Potential surgury 02/19/17 / current 3 weeks 03/12/17   PT Frequency 3x / week   PT Duration 4 weeks   PT Treatment/Interventions ADLs/Self Care Home Management;Cryotherapy;Electrical Stimulation;Ultrasound;Moist Heat;Therapeutic activities;Therapeutic exercise;Neuromuscular re-education;Patient/family education;Passive range of motion;Manual techniques;Vasopneumatic Device   PT Next Visit Plan PROM first 4 weeks per protocol (see media tab).  Modalites PRN. (MD. Theda Sers 03/16/17)   Consulted and Agree with Plan of Care Patient      Patient will benefit from skilled therapeutic intervention in order to improve the following deficits and impairments:  Pain, Decreased activity tolerance, Decreased range of motion  Visit Diagnosis: Acute pain of right shoulder  Muscle weakness (generalized)  Stiffness of right shoulder, not elsewhere classified     Problem List Patient Active Problem List   Diagnosis Date Noted  . Constipation 08/07/2016  . Hyperlipidemia 03/25/2011  . Seasonal allergies     Earl Martinez,CHRIS, PTA 03/19/2017, 3:49 PM  [facility] [place], [place], [address] Phone: [phone]   Fax:  [phone]  Name: Earl Martinez MRN: [id number] Date of Birth: Aug 14, 1961

## 2017-03-23 ENCOUNTER — Ambulatory Visit: Payer: Managed Care, Other (non HMO) | Admitting: Physical Therapy

## 2017-03-23 ENCOUNTER — Encounter: Payer: Self-pay | Admitting: Physical Therapy

## 2017-03-23 DIAGNOSIS — M25511 Pain in right shoulder: Secondary | ICD-10-CM

## 2017-03-23 DIAGNOSIS — M25611 Stiffness of right shoulder, not elsewhere classified: Secondary | ICD-10-CM

## 2017-03-23 DIAGNOSIS — M6281 Muscle weakness (generalized): Secondary | ICD-10-CM

## 2017-03-23 NOTE — Therapy (Signed)
[facility] [place], [place], [address] Phone: [phone]   Fax:  [phone]  Physical Therapy Treatment  Patient Details  Name: Earl Martinez MRN: [id number] Date of Birth: November 23, 1961 Referring Provider: Hart Robinsons MD  Encounter Date: 03/23/2017      PT End of Session - 03/23/17 1512    Visit Number 13   Date for PT Re-Evaluation 03/26/17   PT Start Time 1446   PT Stop Time 1526   PT Time Calculation (min) 40 min   Activity Tolerance Patient tolerated treatment well   Behavior During Therapy [facility] for tasks assessed/performed      Past Medical History:  Diagnosis Date  . Hyperlipemia   . Neck pain   . Rectal abscess   . Seasonal allergies     Past Surgical History:  Procedure Laterality Date  . BACK SURGERY    . NASAL SEPTUM SURGERY    . NECK SURGERY    . RECTAL SURGERY  09/19/11   rectal abscess    There were no vitals filed for this visit.      Subjective Assessment - 03/23/17 1450    Subjective Patient reported ongoing soreness and tightness   Patient Stated Goals Use right arm normally again.   Currently in Pain? Yes   Pain Score 3    Pain Location Shoulder   Pain Orientation Right   Pain Descriptors / Indicators Aching   Pain Type Surgical pain   Pain Onset More than a month ago   Pain Frequency Intermittent   Aggravating Factors  movement   Pain Relieving Factors rest            [facility] PT Assessment - 03/23/17 0001      PROM   PROM Assessment Site Shoulder   Right/Left Shoulder Right   Right Shoulder Flexion 116 Degrees   Right Shoulder External Rotation 52 Degrees                     [facility] Adult PT Treatment/Exercise - 03/23/17 0001      Electrical Stimulation   Electrical Stimulation Location Right shoulder. Premod x 15 mins 80-150hz    Electrical Stimulation Goals Edema;Pain     Vasopneumatic   Number Minutes Vasopneumatic  15 minutes   Vasopnuematic  Location  Shoulder   Vasopneumatic Pressure Medium     Manual Therapy   Manual Therapy Passive ROM   Manual therapy comments gentle PROM for right shoulder flexion/ER/IR with holds                     PT Long Term Goals - 03/09/17 1519      PT LONG TERM GOAL #1   Title Independent with a HEP.   Time 4   Period Weeks   Status On-going     PT LONG TERM GOAL #2   Title Active right shoulder flexion to 145 degrees so the patient can easily reach overhead   Time 4   Period Weeks   Status On-going     PT LONG TERM GOAL #3   Title Active ER to 70 degrees+ to allow for easily donning/doffing of apparel   Time 4   Period Weeks   Status On-going     PT LONG TERM GOAL #4   Title Increase ROM so patient is able to reach behind back   Time 4   Period Weeks   Status On-going     PT LONG  TERM GOAL #5   Title Perform ADL's with pain not > 3/10.   Time 4   Period Weeks   Status On-going               Plan - 03/23/17 1515    Clinical Impression Statement Patient tolerated treatment fairly well. Patient continues to have tightness and discomfort in shoulder upon arrival and guarding with ROM yet able to relax and inprove range with cues and ossilations. Patient improved flexion and ER today. Goals ongoing due to healing protocol limitations.    Rehab Potential Excellent   Clinical Impairments Affecting Rehab Potential surgury 02/19/17 / current 4 weeks 03/19/17   PT Frequency 3x / week   PT Duration 4 weeks   PT Treatment/Interventions ADLs/Self Care Home Management;Cryotherapy;Electrical Stimulation;Ultrasound;Moist Heat;Therapeutic activities;Therapeutic exercise;Neuromuscular re-education;Patient/family education;Passive range of motion;Manual techniques;Vasopneumatic Device   PT Next Visit Plan PROM first 4 weeks per protocol (see media tab).  Modalites PRN. (MD. Theda Sers 03/16/17)   Consulted and Agree with Plan of Care Patient      Patient will benefit from  skilled therapeutic intervention in order to improve the following deficits and impairments:  Pain, Decreased activity tolerance, Decreased range of motion  Visit Diagnosis: Acute pain of right shoulder  Muscle weakness (generalized)  Stiffness of right shoulder, not elsewhere classified     Problem List Patient Active Problem List   Diagnosis Date Noted  . Constipation 08/07/2016  . Hyperlipidemia 03/25/2011  . Seasonal allergies     Margia Wiesen P, PTA 03/23/2017, 3:27 PM  [facility] [address] [place], [place], [address] Phone: [phone]   Fax:  [phone]  Name: Earl Martinez MRN: [id number] Date of Birth: 07-Jan-1961

## 2017-03-25 ENCOUNTER — Ambulatory Visit: Payer: Managed Care, Other (non HMO) | Admitting: Physical Therapy

## 2017-03-25 ENCOUNTER — Encounter: Payer: Self-pay | Admitting: Physical Therapy

## 2017-03-25 DIAGNOSIS — M25511 Pain in right shoulder: Secondary | ICD-10-CM | POA: Diagnosis not present

## 2017-03-25 DIAGNOSIS — M6281 Muscle weakness (generalized): Secondary | ICD-10-CM

## 2017-03-25 DIAGNOSIS — M25611 Stiffness of right shoulder, not elsewhere classified: Secondary | ICD-10-CM

## 2017-03-25 NOTE — Therapy (Signed)
[facility] [place], [place], [address] Phone: [phone]   Fax:  [phone]  Physical Therapy Treatment  Patient Details  Name: Earl Martinez MRN: [id number] Date of Birth: [birth year] Referring Provider: Hart Robinsons MD  Encounter Date: 03/25/2017      PT End of Session - 03/25/17 1511    Visit Number 14   Number of Visits 24   Date for PT Re-Evaluation 04/13/17   PT Start Time 1441   PT Stop Time 1522   PT Time Calculation (min) 41 min   Activity Tolerance Patient tolerated treatment well   Behavior During Therapy [facility] for tasks assessed/performed      Past Medical History:  Diagnosis Date  . Hyperlipemia   . Neck pain   . Rectal abscess   . Seasonal allergies     Past Surgical History:  Procedure Laterality Date  . BACK SURGERY    . NASAL SEPTUM SURGERY    . NECK SURGERY    . RECTAL SURGERY  09/19/11   rectal abscess    There were no vitals filed for this visit.      Subjective Assessment - 03/25/17 1443    Subjective Patient reported ongoing soreness and tightness   Patient Stated Goals Use right arm normally again.   Currently in Pain? Yes   Pain Score 3    Pain Location Shoulder   Pain Orientation Right   Pain Descriptors / Indicators Aching   Pain Type Surgical pain   Pain Onset More than a month ago   Pain Frequency Intermittent   Aggravating Factors  movement   Pain Relieving Factors rest            [facility] PT Assessment - 03/25/17 0001      PROM   PROM Assessment Site Shoulder   Right/Left Shoulder Right   Right Shoulder Flexion 120 Degrees   Right Shoulder External Rotation 54 Degrees                     [facility] Adult PT Treatment/Exercise - 03/25/17 0001      Electrical Stimulation   Electrical Stimulation Location Right shoulder. Premod x 15 mins 80-150hz    Electrical Stimulation Goals Edema;Pain     Vasopneumatic   Number Minutes Vasopneumatic  15  minutes   Vasopnuematic Location  Shoulder   Vasopneumatic Pressure Medium     Manual Therapy   Manual Therapy Passive ROM   Manual therapy comments gentle PROM for right shoulder flexion/ER/IR with holds                     PT Long Term Goals - 03/09/17 1519      PT LONG TERM GOAL #1   Title Independent with a HEP.   Time 4   Period Weeks   Status On-going     PT LONG TERM GOAL #2   Title Active right shoulder flexion to 145 degrees so the patient can easily reach overhead   Time 4   Period Weeks   Status On-going     PT LONG TERM GOAL #3   Title Active ER to 70 degrees+ to allow for easily donning/doffing of apparel   Time 4   Period Weeks   Status On-going     PT LONG TERM GOAL #4   Title Increase ROM so patient is able to reach behind back   Time 4   Period Weeks   Status On-going  PT LONG TERM GOAL #5   Title Perform ADL's with pain not > 3/10.   Time 4   Period Weeks   Status On-going               Plan - 03/25/17 1518    Clinical Impression Statement Patient tolerated treatment well today. Patient has improved with PROM for right shoulder flexion and ER. Patient has guarding and requires ossilations to relax. Patinent progressing toward goals.    Rehab Potential Excellent   Clinical Impairments Affecting Rehab Potential surgury 02/19/17 / current 5 weeks 03/26/17   PT Frequency 3x / week   PT Duration 4 weeks   PT Treatment/Interventions ADLs/Self Care Home Management;Cryotherapy;Electrical Stimulation;Ultrasound;Moist Heat;Therapeutic activities;Therapeutic exercise;Neuromuscular re-education;Patient/family education;Passive range of motion;Manual techniques;Vasopneumatic Device   PT Next Visit Plan PROM first 4 weeks per protocol (see media tab).  Modalites PRN. (MD. Theda Sers 03/16/17)   Consulted and Agree with Plan of Care Patient      Patient will benefit from skilled therapeutic intervention in order to improve the following  deficits and impairments:  Pain, Decreased activity tolerance, Decreased range of motion  Visit Diagnosis: Acute pain of right shoulder  Muscle weakness (generalized)  Stiffness of right shoulder, not elsewhere classified     Problem List Patient Active Problem List   Diagnosis Date Noted  . Constipation 08/07/2016  . Hyperlipidemia 03/25/2011  . Seasonal allergies     DUNFORD, CHRISTINA P, PTA 03/25/2017, 3:22 PM  [facility] [place], [place], [address] Phone: [phone]   Fax:  [phone]  Name: KEIRON IODICE MRN: [id number] Date of Birth: 1961/01/18

## 2017-03-30 ENCOUNTER — Encounter: Payer: Self-pay | Admitting: Physical Therapy

## 2017-03-30 ENCOUNTER — Ambulatory Visit: Payer: Managed Care, Other (non HMO) | Admitting: Physical Therapy

## 2017-03-30 DIAGNOSIS — M25511 Pain in right shoulder: Secondary | ICD-10-CM | POA: Diagnosis not present

## 2017-03-30 DIAGNOSIS — M25611 Stiffness of right shoulder, not elsewhere classified: Secondary | ICD-10-CM

## 2017-03-30 DIAGNOSIS — M6281 Muscle weakness (generalized): Secondary | ICD-10-CM

## 2017-03-30 NOTE — Therapy (Signed)
[facility] [place], [place], [address] Phone: [phone]   Fax:  [phone]  Physical Therapy Treatment  Patient Details  Name: Earl Martinez MRN: [id number] Date of Birth: 1961/01/21 Referring Provider: Hart Robinsons MD  Encounter Date: 03/30/2017      PT End of Session - 03/30/17 1517    Visit Number 15   Number of Visits 24   Date for PT Re-Evaluation 04/13/17   PT Start Time 3614   PT Stop Time 1530   PT Time Calculation (min) 43 min   Activity Tolerance Patient tolerated treatment well   Behavior During Therapy [facility] for tasks assessed/performed      Past Medical History:  Diagnosis Date  . Hyperlipemia   . Neck pain   . Rectal abscess   . Seasonal allergies     Past Surgical History:  Procedure Laterality Date  . BACK SURGERY    . NASAL SEPTUM SURGERY    . NECK SURGERY    . RECTAL SURGERY  09/19/11   rectal abscess    There were no vitals filed for this visit.      Subjective Assessment - 03/30/17 1450    Subjective Patient rolled over on shoulder in bed which caused increased pain, he went to MD and they reported just inflammation in shoulder, no damage   Patient Stated Goals Use right arm normally again.   Currently in Pain? Yes   Pain Score 4    Pain Location Shoulder   Pain Orientation Right   Pain Descriptors / Indicators Aching   Pain Type Surgical pain   Pain Onset More than a month ago   Pain Frequency Intermittent   Aggravating Factors  movement   Pain Relieving Factors rest            [facility] PT Assessment - 03/30/17 0001      PROM   PROM Assessment Site Shoulder   Right/Left Shoulder Right   Right Shoulder Flexion 123 Degrees   Right Shoulder External Rotation 55 Degrees                     [facility] Adult PT Treatment/Exercise - 03/30/17 0001      Electrical Stimulation   Electrical Stimulation Location Right shoulder. Premod x 15 mins 80-150hz    Electrical Stimulation Goals Edema;Pain     Vasopneumatic   Number Minutes Vasopneumatic  15 minutes   Vasopnuematic Location  Shoulder   Vasopneumatic Pressure Medium     Manual Therapy   Manual Therapy Passive ROM   Manual therapy comments gentle PROM for right shoulder flexion/ER/IR with holds                     PT Long Term Goals - 03/09/17 1519      PT LONG TERM GOAL #1   Title Independent with a HEP.   Time 4   Period Weeks   Status On-going     PT LONG TERM GOAL #2   Title Active right shoulder flexion to 145 degrees so the patient can easily reach overhead   Time 4   Period Weeks   Status On-going     PT LONG TERM GOAL #3   Title Active ER to 70 degrees+ to allow for easily donning/doffing of apparel   Time 4   Period Weeks   Status On-going     PT LONG TERM GOAL #4   Title Increase ROM so patient is  able to reach behind back   Time 4   Period Weeks   Status On-going     PT LONG TERM GOAL #5   Title Perform ADL's with pain not > 3/10.   Time 4   Period Weeks   Status On-going               Plan - 03/30/17 1519    Clinical Impression Statement Patient tolrated treatment well today with ongoing guarding during PROM. Patient was able to improve PROM for right shoulder flexion and ER after manual ROM. Patient will no longer sleep in bed per reported after his incident where he rolled over on right shoulder and caused increased pain as he used hand with weight bearing on that right UE. His MD said it was inflammation yet he will sleep in recliner to avoid hurting his arm again. Current goals ongoing due to healing/protocol limitations.    Rehab Potential Excellent   Clinical Impairments Affecting Rehab Potential surgury 02/19/17 / current 5 weeks 03/26/17   PT Frequency 3x / week   PT Duration 4 weeks   PT Treatment/Interventions ADLs/Self Care Home Management;Cryotherapy;Electrical Stimulation;Ultrasound;Moist Heat;Therapeutic  activities;Therapeutic exercise;Neuromuscular re-education;Patient/family education;Passive range of motion;Manual techniques;Vasopneumatic Device   PT Next Visit Plan cont with POC per protocol (see media tab).  Modalites PRN   Consulted and Agree with Plan of Care Patient      Patient will benefit from skilled therapeutic intervention in order to improve the following deficits and impairments:  Pain, Decreased activity tolerance, Decreased range of motion  Visit Diagnosis: Acute pain of right shoulder  Muscle weakness (generalized)  Stiffness of right shoulder, not elsewhere classified     Problem List Patient Active Problem List   Diagnosis Date Noted  . Constipation 08/07/2016  . Hyperlipidemia 03/25/2011  . Seasonal allergies     Julieanne Hadsall P, PTA 03/30/2017, 3:33 PM  [facility] [address] [place], [place], [address] Phone: [phone]   Fax:  [phone]  Name: Earl Martinez MRN: [id number] Date of Birth: [birth year]

## 2017-04-01 ENCOUNTER — Encounter: Payer: Self-pay | Admitting: Pediatrics

## 2017-04-01 ENCOUNTER — Encounter: Payer: Self-pay | Admitting: Physical Therapy

## 2017-04-01 ENCOUNTER — Ambulatory Visit: Payer: Managed Care, Other (non HMO) | Admitting: Physical Therapy

## 2017-04-01 ENCOUNTER — Ambulatory Visit (INDEPENDENT_AMBULATORY_CARE_PROVIDER_SITE_OTHER): Payer: 59 | Admitting: Pediatrics

## 2017-04-01 VITALS — BP 125/81 | HR 64 | Temp 96.9°F | Ht 71.5 in | Wt 204.8 lb

## 2017-04-01 DIAGNOSIS — R350 Frequency of micturition: Secondary | ICD-10-CM

## 2017-04-01 DIAGNOSIS — M25611 Stiffness of right shoulder, not elsewhere classified: Secondary | ICD-10-CM

## 2017-04-01 DIAGNOSIS — E785 Hyperlipidemia, unspecified: Secondary | ICD-10-CM

## 2017-04-01 DIAGNOSIS — M6281 Muscle weakness (generalized): Secondary | ICD-10-CM

## 2017-04-01 DIAGNOSIS — M25511 Pain in right shoulder: Secondary | ICD-10-CM | POA: Diagnosis not present

## 2017-04-01 DIAGNOSIS — N401 Enlarged prostate with lower urinary tract symptoms: Secondary | ICD-10-CM | POA: Diagnosis not present

## 2017-04-01 MED ORDER — TAMSULOSIN HCL 0.4 MG PO CAPS: 0.4000 mg | ORAL_CAPSULE | Freq: Every day | ORAL | 3 refills | Status: DC

## 2017-04-01 MED ORDER — ATORVASTATIN CALCIUM 40 MG PO TABS: 40.0000 mg | ORAL_TABLET | Freq: Every day | ORAL | 3 refills | Status: DC

## 2017-04-01 NOTE — Progress Notes (Signed)
  Subjective:   Patient ID: Earl Martinez, male    DOB: 16-Aug-1961, 56 y.o.   MRN: [id number] CC: Follow-up mulitple med problems HPI: Earl Martinez is a 56 y.o. male presenting for Follow-up  HLD: taking atorvastatin regulalry No side effects  BPH: still wakes up at night while on the flomax Has been ongoing for years, he thinks since his 76s Not worsening  Drinks 1-2 sodas a day Does drink something in the evening Avoids voiding during the day due to work requirements  Shoulder surgery apprx 6 weeks ago Healing well, in PT Pt thinks healing well, improving ROM, still out of work  Relevant past medical, surgical, family and social history reviewed. Allergies and medications reviewed and updated. History  Smoking Status  . Never Smoker  Smokeless Tobacco  . Never Used   ROS: Per HPI   Objective:    BP 125/81   Pulse 64   Temp (!) 96.9 F (36.1 C) (Oral)   Ht 5' 11.5" (1.816 m)   Wt 204 lb 12.8 oz (92.9 kg)   BMI 28.17 kg/m   Wt Readings from Last 3 Encounters:  04/01/17 204 lb 12.8 oz (92.9 kg)  09/02/16 205 lb (93 kg)  08/07/16 201 lb 3.2 oz (91.3 kg)    Gen: NAD, alert, cooperative with exam, NCAT EYES: EOMI, no conjunctival injection, or no icterus CV: NRRR, normal S1/S2, no murmur, distal pulses 2+ b/l Resp: CTABL, no wheezes, normal WOB Abd: +BS, soft, NTND. Ext: No edema, warm Neuro: Alert and oriented, strength equal b/l UE and LE, coordination grossly normal MSK:R arm in sling  Assessment & Plan:  Earl Martinez was seen today for follow-up med problems.  Diagnoses and all orders for this visit:  Hyperlipidemia, unspecified hyperlipidemia type Stable, cont current statin -     atorvastatin (LIPITOR) 40 MG tablet; Take 1 tablet (40 mg total) by mouth daily.  Benign prostatic hyperplasia with urinary frequency Discussed regular voiding, decreasing caffeine, no fluids after dinner If ongoing symptoms let m eknow, will refer to urology -     tamsulosin  (FLOMAX) 0.4 MG CAPS capsule; Take 1 capsule (0.4 mg total) by mouth daily.   Follow up plan: Return in about 11 months (around 02/17/2018). [name], MD [place]

## 2017-04-01 NOTE — Therapy (Signed)
[facility] [place], [place], [address] Phone: [phone]   Fax:  [phone]  Physical Therapy Treatment  Patient Details  Name: SALMAAN PATCHIN MRN: [id number] Date of Birth: [birth year] Referring Provider: Hart Robinsons MD  Encounter Date: 04/01/2017      PT End of Session - 04/01/17 1517    Visit Number 16   Number of Visits 24   Date for PT Re-Evaluation 04/13/17   PT Start Time 1444   PT Stop Time 1529   PT Time Calculation (min) 45 min   Activity Tolerance Patient tolerated treatment well   Behavior During Therapy [facility] for tasks assessed/performed      Past Medical History:  Diagnosis Date  . Hyperlipemia   . Neck pain   . Rectal abscess   . Seasonal allergies     Past Surgical History:  Procedure Laterality Date  . BACK SURGERY    . NASAL SEPTUM SURGERY    . NECK SURGERY    . RECTAL SURGERY  09/19/11   rectal abscess  . ROTATOR CUFF REPAIR Right 02/19/2017   Dr. Theda Sers, [place]    There were no vitals filed for this visit.      Subjective Assessment - 04/01/17 1452    Subjective Patient feeling better today   Patient Stated Goals Use right arm normally again.   Currently in Pain? Yes   Pain Score 3    Pain Location Shoulder   Pain Orientation Right   Pain Descriptors / Indicators Aching   Pain Type Surgical pain   Pain Onset More than a month ago   Pain Frequency Intermittent   Aggravating Factors  movement   Pain Relieving Factors rest            [facility] PT Assessment - 04/01/17 0001      PROM   PROM Assessment Site Shoulder   Right/Left Shoulder Right   Right Shoulder Flexion 125 Degrees   Right Shoulder External Rotation 56 Degrees                     [facility] Adult PT Treatment/Exercise - 04/01/17 0001      Electrical Stimulation   Electrical Stimulation Location Right shoulder. Premod x 15 mins 80-150hz    Electrical Stimulation Goals Edema;Pain     Vasopneumatic   Number Minutes Vasopneumatic  15 minutes   Vasopnuematic Location  Shoulder   Vasopneumatic Pressure Medium     Manual Therapy   Manual Therapy Passive ROM   Manual therapy comments gentle PROM for right shoulder flexion/ER/IR with holds                     PT Long Term Goals - 03/09/17 1519      PT LONG TERM GOAL #1   Title Independent with a HEP.   Time 4   Period Weeks   Status On-going     PT LONG TERM GOAL #2   Title Active right shoulder flexion to 145 degrees so the patient can easily reach overhead   Time 4   Period Weeks   Status On-going     PT LONG TERM GOAL #3   Title Active ER to 70 degrees+ to allow for easily donning/doffing of apparel   Time 4   Period Weeks   Status On-going     PT LONG TERM GOAL #4   Title Increase ROM so patient is able to reach behind back  Time 4   Period Weeks   Status On-going     PT LONG TERM GOAL #5   Title Perform ADL's with pain not > 3/10.   Time 4   Period Weeks   Status On-going               Plan - 04/01/17 1518    Clinical Impression Statement Patient reported feeling better overall today. Patient tolerated treatment well today and has improved PROM for flexion and ER today. Patient has ongoing guarding in right shoulder and requires ossilations to relax. Patient goals ongoing due to healing /protocol.    Rehab Potential Excellent   Clinical Impairments Affecting Rehab Potential surgury 02/19/17 / current 6 weeks 04/02/17   PT Frequency 3x / week   PT Duration 4 weeks   PT Treatment/Interventions ADLs/Self Care Home Management;Cryotherapy;Electrical Stimulation;Ultrasound;Moist Heat;Therapeutic activities;Therapeutic exercise;Neuromuscular re-education;Patient/family education;Passive range of motion;Manual techniques;Vasopneumatic Device   PT Next Visit Plan cont with POC per protocol (see media tab).  Modalites PRN   Consulted and Agree with Plan of Care Patient      Patient  will benefit from skilled therapeutic intervention in order to improve the following deficits and impairments:  Pain, Decreased activity tolerance, Decreased range of motion  Visit Diagnosis: Acute pain of right shoulder  Muscle weakness (generalized)  Stiffness of right shoulder, not elsewhere classified     Problem List Patient Active Problem List   Diagnosis Date Noted  . Constipation 08/07/2016  . Hyperlipidemia 03/25/2011  . Seasonal allergies     Gilverto Dileonardo P, PTA 04/01/2017, 3:33 PM  [facility] [address] [place], [place], [address] Phone: [phone]   Fax:  [phone]  Name: DEVERY MURGIA MRN: [id number] Date of Birth: 06-Sep-1961

## 2017-04-03 ENCOUNTER — Encounter: Payer: Self-pay | Admitting: Physical Therapy

## 2017-04-03 ENCOUNTER — Ambulatory Visit: Payer: Managed Care, Other (non HMO) | Admitting: Physical Therapy

## 2017-04-03 DIAGNOSIS — M25611 Stiffness of right shoulder, not elsewhere classified: Secondary | ICD-10-CM

## 2017-04-03 DIAGNOSIS — M25511 Pain in right shoulder: Secondary | ICD-10-CM | POA: Diagnosis not present

## 2017-04-03 DIAGNOSIS — M6281 Muscle weakness (generalized): Secondary | ICD-10-CM

## 2017-04-03 NOTE — Therapy (Signed)
[facility] [place], [place], [address] Phone: [phone]   Fax:  [phone]  Physical Therapy Treatment  Patient Details  Name: Earl Martinez MRN: [id number] Date of Birth: [birth year] Referring Provider: Hart Robinsons MD  Encounter Date: 04/03/2017      PT End of Session - 04/03/17 0908    Visit Number 14  from 12/08/16   Number of Visits 24   Date for PT Re-Evaluation 78/58/85  Send recert on 0/2/77   PT Start Time 0900   PT Stop Time 0945   PT Time Calculation (min) 45 min   Activity Tolerance Patient tolerated treatment well   Behavior During Therapy [facility] for tasks assessed/performed      Past Medical History:  Diagnosis Date  . Hyperlipemia   . Neck pain   . Rectal abscess   . Seasonal allergies     Past Surgical History:  Procedure Laterality Date  . BACK SURGERY    . NASAL SEPTUM SURGERY    . NECK SURGERY    . RECTAL SURGERY  09/19/11   rectal abscess  . ROTATOR CUFF REPAIR Right 02/19/2017   Dr. Theda Sers, [place]    There were no vitals filed for this visit.      Subjective Assessment - 04/03/17 1308    Subjective Patient arriving to therapy today reporting mild shoulder pain of 4/10.    Patient Stated Goals Use right arm normally again.   Currently in Pain? Yes   Pain Score 4    Pain Location Shoulder   Pain Orientation Right   Pain Descriptors / Indicators Aching   Pain Type Surgical pain   Aggravating Factors  movements   Pain Relieving Factors rest                         [facility] Adult PT Treatment/Exercise - 04/03/17 0001      Shoulder Exercises: Supine   External Rotation AAROM;Right;15 reps  cane     Shoulder Exercises: Seated   Other Seated Exercises UE ranger, flexion, clockwise circles, counter clockwise circles   Other Seated Exercises Issued red theraputty and instructed pt in twisting, squeezes, pulling apart     Shoulder Exercises: Pulleys   Flexion 3 minutes     Shoulder Exercises: ROM/Strengthening   UBE (Upper Arm Bike) 4 minutes: 2 minutes each direction     Electrical Stimulation   Electrical Stimulation Location Right shoulder. Premod x 15 mins 80-150hz    Electrical Stimulation Goals Edema;Pain     Vasopneumatic   Number Minutes Vasopneumatic  15 minutes   Vasopnuematic Location  Shoulder   Vasopneumatic Pressure Medium     Manual Therapy   Manual Therapy Passive ROM   Manual therapy comments gentle PROM for right shoulder flexion/ER/IR with holds                PT Education - 04/03/17 1309    Education Details HEP, recommended pt still use sling for long distance amb for support and instructed in proper wrist placement. Issued red theraputty for R wrist/hand exercises at home.    Person(s) Educated Patient   Methods Explanation;Demonstration   Comprehension Verbalized understanding;Returned demonstration             PT Long Term Goals - 04/03/17 1313      PT LONG TERM GOAL #1   Title Independent with a HEP.   Time 4   Period Weeks   Status  On-going     PT LONG TERM GOAL #2   Title Active right shoulder flexion to 145 degrees so the patient can easily reach overhead   Period Weeks   Status On-going     PT LONG TERM GOAL #3   Title Active ER to 70 degrees+ to allow for easily donning/doffing of apparel   Time 4   Period Weeks   Status On-going     PT LONG TERM GOAL #4   Title Increase ROM so patient is able to reach behind back   Time 4   Period Weeks     PT LONG TERM GOAL #5   Title Perform ADL's with pain not > 3/10.   Time 4   Period Weeks   Status On-going               Plan - 04/03/17 1310    Clinical Impression Statement Patient reported mild pain in R shoulder. Pt aslo complaining of right wrist soreness/stiffness. Pt was issued red theraputty for ROM and strengthening. Pt edu in proper sling placement and wearing to support his R wrist. Pt tolerated ROM  exercises and AAROM exercises well today beginnig, UBE, UE ranger, and pulleys. Continue to progress pt as protocol advises and pt tolerates.    Rehab Potential Excellent   Clinical Impairments Affecting Rehab Potential surgury 02/19/17 / current 6 weeks 04/02/17   PT Frequency 3x / week   PT Duration 4 weeks   PT Treatment/Interventions ADLs/Self Care Home Management;Cryotherapy;Electrical Stimulation;Ultrasound;Moist Heat;Therapeutic activities;Therapeutic exercise;Neuromuscular re-education;Patient/family education;Passive range of motion;Manual techniques;Vasopneumatic Device   PT Next Visit Plan cont with POC per protocol (see media tab).  Modalites PRN   PT Home Exercise Plan ER in supine with cane, putty hand/wrist exercises   Consulted and Agree with Plan of Care Patient      Patient will benefit from skilled therapeutic intervention in order to improve the following deficits and impairments:  Pain, Decreased activity tolerance, Decreased range of motion  Visit Diagnosis: Acute pain of right shoulder  Muscle weakness (generalized)  Stiffness of right shoulder, not elsewhere classified     Problem List Patient Active Problem List   Diagnosis Date Noted  . Constipation 08/07/2016  . Hyperlipidemia 03/25/2011  . Seasonal allergies     Oretha Caprice, MPT 04/03/2017, 1:16 PM  [facility] [place], [place], [address] Phone: [phone]   Fax:  [phone]  Name: Earl Martinez MRN: [id number] Date of Birth: February 24, 1961

## 2017-04-06 ENCOUNTER — Ambulatory Visit: Payer: Managed Care, Other (non HMO) | Admitting: Physical Therapy

## 2017-04-06 ENCOUNTER — Encounter: Payer: Self-pay | Admitting: Physical Therapy

## 2017-04-06 DIAGNOSIS — M25511 Pain in right shoulder: Secondary | ICD-10-CM | POA: Diagnosis not present

## 2017-04-06 DIAGNOSIS — M6281 Muscle weakness (generalized): Secondary | ICD-10-CM

## 2017-04-06 DIAGNOSIS — M25611 Stiffness of right shoulder, not elsewhere classified: Secondary | ICD-10-CM

## 2017-04-06 NOTE — Therapy (Signed)
[facility] [place], [place], [address] Phone: [phone]   Fax:  [phone]  Physical Therapy Treatment  Patient Details  Name: Earl Martinez MRN: [id number] Date of Birth: [birth year] Referring Provider: Hart Robinsons MD  Encounter Date: 04/06/2017      PT End of Session - 04/06/17 1519    Visit Number 15   Number of Visits 24   Date for PT Re-Evaluation 04/13/17   PT Start Time 8250   PT Stop Time 1530   PT Time Calculation (min) 51 min   Activity Tolerance Patient tolerated treatment well   Behavior During Therapy [facility] for tasks assessed/performed      Past Medical History:  Diagnosis Date  . Hyperlipemia   . Neck pain   . Rectal abscess   . Seasonal allergies     Past Surgical History:  Procedure Laterality Date  . BACK SURGERY    . NASAL SEPTUM SURGERY    . NECK SURGERY    . RECTAL SURGERY  09/19/11   rectal abscess  . ROTATOR CUFF REPAIR Right 02/19/2017   Dr. Theda Sers, [place]    There were no vitals filed for this visit.      Subjective Assessment - 04/06/17 1441    Subjective Patient tolerated last treatment well and no sling upon arrival   Patient Stated Goals Use right arm normally again.   Currently in Pain? Yes   Pain Score 3    Pain Location Shoulder   Pain Orientation Right   Pain Descriptors / Indicators Aching   Pain Type Surgical pain   Pain Onset More than a month ago   Pain Frequency Intermittent   Aggravating Factors  certain movements   Pain Relieving Factors at rest            [facility] PT Assessment - 04/06/17 0001      PROM   PROM Assessment Site Shoulder   Right/Left Shoulder Right   Right Shoulder Flexion 131 Degrees   Right Shoulder External Rotation 60 Degrees                     [facility] Adult PT Treatment/Exercise - 04/06/17 0001      Shoulder Exercises: Supine   Other Supine Exercises supine cane for flexion ER x10 each     Shoulder Exercises: Seated   Other Seated Exercises UE ranger, flexion, clockwise circles, counter clockwise circles     Shoulder Exercises: Pulleys   Flexion Other (comment)  31min     Electrical Stimulation   Electrical Stimulation Location Right shoulder. Premod x 15 mins 80-150hz    Electrical Stimulation Goals Edema;Pain     Vasopneumatic   Number Minutes Vasopneumatic  15 minutes   Vasopnuematic Location  Shoulder   Vasopneumatic Pressure Medium     Manual Therapy   Manual Therapy Passive ROM   Manual therapy comments gentle PROM for right shoulder flexion/ER/IR with holds                     PT Long Term Goals - 04/03/17 1313      PT LONG TERM GOAL #1   Title Independent with a HEP.   Time 4   Period Weeks   Status On-going     PT LONG TERM GOAL #2   Title Active right shoulder flexion to 145 degrees so the patient can easily reach overhead   Period Weeks   Status On-going  PT LONG TERM GOAL #3   Title Active ER to 70 degrees+ to allow for easily donning/doffing of apparel   Time 4   Period Weeks   Status On-going     PT LONG TERM GOAL #4   Title Increase ROM so patient is able to reach behind back   Time 4   Period Weeks     PT LONG TERM GOAL #5   Title Perform ADL's with pain not > 3/10.   Time 4   Period Weeks   Status On-going               Plan - 04/06/17 1520    Clinical Impression Statement Patient tolerated treatment well today and progressing well with AAROM activities per protocol. Patient able to progress with improved PROM for right shoulder flexion and ER. Patient goals progressing yet ongoing due to protocol limitations.   Rehab Potential Excellent   Clinical Impairments Affecting Rehab Potential surgury 02/19/17 / current 6 weeks 04/02/17   PT Frequency 3x / week   PT Duration 4 weeks   PT Treatment/Interventions ADLs/Self Care Home Management;Cryotherapy;Electrical Stimulation;Ultrasound;Moist Heat;Therapeutic  activities;Therapeutic exercise;Neuromuscular re-education;Patient/family education;Passive range of motion;Manual techniques;Vasopneumatic Device   PT Next Visit Plan cont with POC per protocol (see media tab).  Modalites PRN (MD. 04/13/17)   Consulted and Agree with Plan of Care Patient      Patient will benefit from skilled therapeutic intervention in order to improve the following deficits and impairments:  Pain, Decreased activity tolerance, Decreased range of motion  Visit Diagnosis: Acute pain of right shoulder  Muscle weakness (generalized)  Stiffness of right shoulder, not elsewhere classified     Problem List Patient Active Problem List   Diagnosis Date Noted  . Constipation 08/07/2016  . Hyperlipidemia 03/25/2011  . Seasonal allergies     Niralya Ohanian P, PTA 04/06/2017, 3:30 PM  [facility] [address] [place], [place], [address] Phone: [phone]   Fax:  [phone]  Name: Earl Martinez MRN: [id number] Date of Birth: 08/10/1961

## 2017-04-09 ENCOUNTER — Ambulatory Visit: Payer: Managed Care, Other (non HMO) | Attending: Specialist | Admitting: *Deleted

## 2017-04-09 DIAGNOSIS — M25511 Pain in right shoulder: Secondary | ICD-10-CM | POA: Diagnosis present

## 2017-04-09 DIAGNOSIS — M6281 Muscle weakness (generalized): Secondary | ICD-10-CM | POA: Insufficient documentation

## 2017-04-09 DIAGNOSIS — M25611 Stiffness of right shoulder, not elsewhere classified: Secondary | ICD-10-CM | POA: Insufficient documentation

## 2017-04-09 NOTE — Therapy (Signed)
[facility] [place], [place], [address] Phone: [phone]   Fax:  [phone]  Physical Therapy Treatment  Patient Details  Name: Earl Martinez MRN: [id number] Date of Birth: September 01, 1961 Referring Provider: Hart Robinsons MD  Encounter Date: 04/09/2017      PT End of Session - 04/09/17 1700    Visit Number 16   Number of Visits 24   Date for PT Re-Evaluation 04/13/17   PT Start Time 1600   PT Stop Time 1700   PT Time Calculation (min) 60 min   Activity Tolerance Patient tolerated treatment well   Behavior During Therapy [facility] for tasks assessed/performed      Past Medical History:  Diagnosis Date  . Hyperlipemia   . Neck pain   . Rectal abscess   . Seasonal allergies     Past Surgical History:  Procedure Laterality Date  . BACK SURGERY    . NASAL SEPTUM SURGERY    . NECK SURGERY    . RECTAL SURGERY  09/19/11   rectal abscess  . ROTATOR CUFF REPAIR Right 02/19/2017   Dr. Theda Sers, [place]    There were no vitals filed for this visit.                       [facility] Adult PT Treatment/Exercise - 04/09/17 0001      Shoulder Exercises: Supine   Other Supine Exercises supine cane for flexion  and chest press , ER x20 each     Shoulder Exercises: Seated   Other Seated Exercises UE ranger, flexion, clockwise circles, counter clockwise circles x 5 mins     Shoulder Exercises: Pulleys   Flexion Other (comment)  71min     Electrical Stimulation   Electrical Stimulation Location Right shoulder. Premod x 15 mins 80-150hz    Electrical Stimulation Goals Edema;Pain     Vasopneumatic   Number Minutes Vasopneumatic  15 minutes   Vasopnuematic Location  Shoulder   Vasopneumatic Pressure Medium   Vasopneumatic Temperature  38     Manual Therapy   Manual Therapy Passive ROM   Manual therapy comments gentle PROM for right shoulder flexion/ER/IR with holds                      PT Long Term Goals - 04/03/17 1313      PT LONG TERM GOAL #1   Title Independent with a HEP.   Time 4   Period Weeks   Status On-going     PT LONG TERM GOAL #2   Title Active right shoulder flexion to 145 degrees so the patient can easily reach overhead   Period Weeks   Status On-going     PT LONG TERM GOAL #3   Title Active ER to 70 degrees+ to allow for easily donning/doffing of apparel   Time 4   Period Weeks   Status On-going     PT LONG TERM GOAL #4   Title Increase ROM so patient is able to reach behind back   Time 4   Period Weeks     PT LONG TERM GOAL #5   Title Perform ADL's with pain not > 3/10.   Time 4   Period Weeks   Status On-going               Plan - 04/09/17 1704    Clinical Impression Statement Pt arrived to clinic today  doing fairly well with minimal  RT shldr pain. He did well with AAROM exs and has been performing supine cane exs at home. Pt did a little better today with PROM for flexion and ER, but LTGs are ongoing. MD note tomorrow.   PT Treatment/Interventions ADLs/Self Care Home Management;Cryotherapy;Electrical Stimulation;Ultrasound;Moist Heat;Therapeutic activities;Therapeutic exercise;Neuromuscular re-education;Patient/family education;Passive range of motion;Manual techniques;Vasopneumatic Device   PT Next Visit Plan cont with POC per protocol (see media tab).  Modalites PRN (MD. 04/13/17)               MD note next visit   PT Home Exercise Plan ER in supine with cane, putty hand/wrist exercises   Consulted and Agree with Plan of Care Patient      Patient will benefit from skilled therapeutic intervention in order to improve the following deficits and impairments:  Pain, Decreased activity tolerance, Decreased range of motion  Visit Diagnosis: Acute pain of right shoulder  Muscle weakness (generalized)  Stiffness of right shoulder, not elsewhere classified     Problem List Patient Active Problem List   Diagnosis Date Noted   . Constipation 08/07/2016  . Hyperlipidemia 03/25/2011  . Seasonal allergies     RAMSEUR,CHRIS , PTA 04/09/2017, 5:16 PM  [facility] [address] [place], [place], [address] Phone: [phone]   Fax:  [phone]  Name: Earl Martinez MRN: [id number] Date of Birth: Jun 13, 1961

## 2017-04-10 ENCOUNTER — Ambulatory Visit: Payer: Managed Care, Other (non HMO) | Admitting: Physical Therapy

## 2017-04-10 ENCOUNTER — Encounter: Payer: Self-pay | Admitting: Physical Therapy

## 2017-04-10 DIAGNOSIS — M25611 Stiffness of right shoulder, not elsewhere classified: Secondary | ICD-10-CM

## 2017-04-10 DIAGNOSIS — M25511 Pain in right shoulder: Secondary | ICD-10-CM | POA: Diagnosis not present

## 2017-04-10 DIAGNOSIS — M6281 Muscle weakness (generalized): Secondary | ICD-10-CM

## 2017-04-10 NOTE — Therapy (Signed)
[facility] [place], [place], [address] Phone: [phone]   Fax:  [phone]  Physical Therapy Treatment  Patient Details  Name: Earl Martinez MRN: [id number] Date of Birth: 12-14-1960 Referring Provider: Hart Robinsons MD  Encounter Date: 04/10/2017      PT End of Session - 04/10/17 0824    Visit Number 17   Number of Visits 24   Date for PT Re-Evaluation 04/13/17   PT Start Time 0817   PT Stop Time 0910   PT Time Calculation (min) 53 min   Activity Tolerance Patient tolerated treatment well   Behavior During Therapy [facility] for tasks assessed/performed      Past Medical History:  Diagnosis Date  . Hyperlipemia   . Neck pain   . Rectal abscess   . Seasonal allergies     Past Surgical History:  Procedure Laterality Date  . BACK SURGERY    . NASAL SEPTUM SURGERY    . NECK SURGERY    . RECTAL SURGERY  09/19/11   rectal abscess  . ROTATOR CUFF REPAIR Right 02/19/2017   Dr. Theda Sers, [place]    There were no vitals filed for this visit.      Subjective Assessment - 04/10/17 0823    Subjective Reports that he is still a little sore but not bad. Reports that he can have parcels up to 75# at work.   Patient Stated Goals Use right arm normally again.   Currently in Pain? Yes   Pain Score 3    Pain Location Shoulder   Pain Orientation Right   Pain Descriptors / Indicators Sore   Pain Type Surgical pain   Pain Onset More than a month ago            [facility] PT Assessment - 04/10/17 0001      Assessment   Medical Diagnosis Right shoulder SAD;DCR and RCR.   Onset Date/Surgical Date 02/19/17   Next MD Visit 04/13/2017     Restrictions   Weight Bearing Restrictions No     ROM / Strength   AROM / PROM / Strength PROM     PROM   PROM Assessment Site Shoulder   Right/Left Shoulder Right   Right Shoulder Flexion 130 Degrees   Right Shoulder Internal Rotation 55 Degrees   Right Shoulder External  Rotation 43 Degrees                     [facility] Adult PT Treatment/Exercise - 04/10/17 0001      Shoulder Exercises: Supine   Protraction AAROM;Both;20 reps   External Rotation AAROM;Both;20 reps   Flexion AAROM;Both;20 reps     Shoulder Exercises: Pulleys   Flexion Other (comment)  x6 min   Other Pulley Exercises Seated UE ranger flex/circles x5 min     Modalities   Modalities Electrical Stimulation;Vasopneumatic     Electrical Stimulation   Electrical Stimulation Location R shoulder   Electrical Stimulation Action Pre-Mod   Electrical Stimulation Parameters 80-150 hz x15 min   Electrical Stimulation Goals Edema;Pain     Vasopneumatic   Number Minutes Vasopneumatic  15 minutes   Vasopnuematic Location  Shoulder   Vasopneumatic Pressure Medium   Vasopneumatic Temperature  34     Manual Therapy   Manual Therapy Passive ROM   Passive ROM PROM of R shoulder into flex/ER/IR with holds at end range  PT Long Term Goals - 04/10/17 0855      PT LONG TERM GOAL #1   Title Independent with a HEP.   Time 4   Period Weeks   Status Achieved     PT LONG TERM GOAL #2   Title Active right shoulder flexion to 145 degrees so the patient can easily reach overhead   Period Weeks   Status On-going     PT LONG TERM GOAL #3   Title Active ER to 70 degrees+ to allow for easily donning/doffing of apparel   Time 4   Period Weeks   Status On-going     PT LONG TERM GOAL #4   Title Increase ROM so patient is able to reach behind back   Time 4   Period Weeks     PT LONG TERM GOAL #5   Title Perform ADL's with pain not > 3/10.   Time 4   Period Weeks   Status On-going               Plan - 04/10/17 6759    Clinical Impression Statement Patient tolerated today's treatment well with low level R shoulder pain reported upon arrival. Patient reported improvement with completion of AAROM exercises today especially with pulley into  flexion today. PROM into ER was more limited today than in previous treatments and in previous measurements. Firm end feels noted during PROM of R shoulder into flex/ER/IR and smooth arc of motion in all directions. Normal modalities response noted following removal of the modalities.   Rehab Potential Excellent   Clinical Impairments Affecting Rehab Potential surgury 02/19/17 / current 6 weeks 04/02/17   PT Frequency 3x / week   PT Duration 4 weeks   PT Treatment/Interventions ADLs/Self Care Home Management;Cryotherapy;Electrical Stimulation;Ultrasound;Moist Heat;Therapeutic activities;Therapeutic exercise;Neuromuscular re-education;Patient/family education;Passive range of motion;Manual techniques;Vasopneumatic Device   PT Next Visit Plan cont with POC per protocol (see media tab).  Modalites PRN (MD. 04/13/17)                 PT Home Exercise Plan ER in supine with cane, putty hand/wrist exercises   Consulted and Agree with Plan of Care Patient      Patient will benefit from skilled therapeutic intervention in order to improve the following deficits and impairments:  Pain, Decreased activity tolerance, Decreased range of motion  Visit Diagnosis: Acute pain of right shoulder  Muscle weakness (generalized)  Stiffness of right shoulder, not elsewhere classified     Problem List Patient Active Problem List   Diagnosis Date Noted  . Constipation 08/07/2016  . Hyperlipidemia 03/25/2011  . Seasonal allergies     Ahmed Prima, PTA 04/10/17 11:58 AM Mali Applegate MPT [facility] [address] [place], [place], [address] Phone: [phone]   Fax:  [phone]  Name: Earl Martinez MRN: [id number] Date of Birth: 1961/05/15

## 2017-04-13 ENCOUNTER — Encounter: Payer: Self-pay | Admitting: Physical Therapy

## 2017-04-13 ENCOUNTER — Ambulatory Visit: Payer: Managed Care, Other (non HMO) | Admitting: Physical Therapy

## 2017-04-13 DIAGNOSIS — M25511 Pain in right shoulder: Secondary | ICD-10-CM | POA: Diagnosis not present

## 2017-04-13 DIAGNOSIS — M25611 Stiffness of right shoulder, not elsewhere classified: Secondary | ICD-10-CM

## 2017-04-13 DIAGNOSIS — M6281 Muscle weakness (generalized): Secondary | ICD-10-CM

## 2017-04-13 NOTE — Therapy (Signed)
[facility] [place], [place], [address] Phone: [phone]   Fax:  [phone]  Physical Therapy Treatment  Patient Details  Name: Earl Martinez MRN: [id number] Date of Birth: June 19, 1961 Referring Provider: Hart Robinsons MD  Encounter Date: 04/13/2017      PT End of Session - 04/13/17 1316    Visit Number 18   Number of Visits 24   Date for PT Re-Evaluation 04/13/17   PT Start Time 1230   PT Stop Time 1330   PT Time Calculation (min) 60 min   Activity Tolerance Patient tolerated treatment well   Behavior During Therapy [facility] for tasks assessed/performed      Past Medical History:  Diagnosis Date  . Hyperlipemia   . Neck pain   . Rectal abscess   . Seasonal allergies     Past Surgical History:  Procedure Laterality Date  . BACK SURGERY    . NASAL SEPTUM SURGERY    . NECK SURGERY    . RECTAL SURGERY  09/19/11   rectal abscess  . ROTATOR CUFF REPAIR Right 02/19/2017   Dr. Theda Sers, [place]    There were no vitals filed for this visit.      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   Currently in Pain? Yes   Pain Score 4    Pain Location Shoulder   Pain Orientation Right   Pain Descriptors / Indicators Sore   Pain Type Surgical pain   Pain Onset More than a month ago   Pain Frequency Intermittent   Aggravating Factors  use and certain movements   Pain Relieving Factors at rest            [facility] PT Assessment - 04/13/17 0001      PROM   PROM Assessment Site Shoulder   Right/Left Shoulder Right   Right Shoulder Flexion 132 Degrees   Right Shoulder Internal Rotation 55 Degrees   Right Shoulder External Rotation 61 Degrees                      [facility] Adult PT Treatment/Exercise - 04/13/17 0001      Shoulder Exercises: Supine   Other Supine Exercises supine cane for flexion  and chest press , ER x20 each     Shoulder Exercises: Pulleys   Flexion Other (comment)  58min   Other Pulley Exercises standing UE ranger for elevation and circles 2x10     Electrical Stimulation   Electrical Stimulation Location R shoulder   Electrical Stimulation Action premod   Electrical Stimulation Parameters 80-150hz x32min   Electrical Stimulation Goals Edema;Pain     Vasopneumatic   Number Minutes Vasopneumatic  15 minutes   Vasopnuematic Location  Shoulder   Vasopneumatic Pressure Medium     Manual Therapy   Manual Therapy Passive ROM   Passive ROM PROM of R shoulder into flex/ER/IR with holds at end range                     PT Long Term Goals - 04/10/17 0855      PT LONG TERM GOAL #1   Title Independent with a HEP.   Time 4   Period  Weeks   Status Achieved     PT LONG TERM GOAL #2   Title Active right shoulder flexion to 145 degrees so the patient can easily reach overhead   Period Weeks   Status On-going     PT LONG TERM GOAL #3   Title Active ER to 70 degrees+ to allow for easily donning/doffing of apparel   Time 4   Period Weeks   Status On-going     PT LONG TERM GOAL #4   Title Increase ROM so patient is able to reach behind back   Time 4   Period Weeks     PT LONG TERM GOAL #5   Title Perform ADL's with pain not > 3/10.   Time 4   Period Weeks   Status On-going               Plan - 04/13/17 1317    Clinical Impression Statement Patient tolerated treatment well today. Patient arrived with some increased soreness and ongoing stiffness in right shoulder yet was able to increase ROM after manual PROM. Increased ROM for flexion, IR, and ER today. Patient has ongoing guarding and tightnes and requires ossilations for muscles to relax. Patient goals ongoing due to  healing/protocol.   Rehab Potential Excellent   Clinical Impairments Affecting Rehab Potential surgury 02/19/17 / current 8 weeks 04/16/17   PT Frequency 3x / week   PT Duration 4 weeks   PT Treatment/Interventions ADLs/Self Care Home Management;Cryotherapy;Electrical Stimulation;Ultrasound;Moist Heat;Therapeutic activities;Therapeutic exercise;Neuromuscular re-education;Patient/family education;Passive range of motion;Manual techniques;Vasopneumatic Device   PT Next Visit Plan cont with POC per protocol (see media tab).  Modalites PRN (MD. 04/13/17)                 Consulted and Agree with Plan of Care Patient      Patient will benefit from skilled therapeutic intervention in order to improve the following deficits and impairments:  Pain, Decreased activity tolerance, Decreased range of motion  Visit Diagnosis: Acute pain of right shoulder  Muscle weakness (generalized)  Stiffness of right shoulder, not elsewhere classified     Problem List Patient Active Problem List   Diagnosis Date Noted  . Constipation 08/07/2016  . Hyperlipidemia 03/25/2011  . Seasonal allergies     Ladean Raya, PTA 04/13/17 7:11 PM Mali Applegate MPT [facility] [address] [place], [place], [address] Phone: [phone]   Fax:  [phone]  Name: Earl Martinez MRN: [id number] Date of Birth: October 04, 1961

## 2017-04-16 ENCOUNTER — Ambulatory Visit: Payer: Managed Care, Other (non HMO) | Admitting: *Deleted

## 2017-04-16 DIAGNOSIS — M25511 Pain in right shoulder: Secondary | ICD-10-CM

## 2017-04-16 DIAGNOSIS — M6281 Muscle weakness (generalized): Secondary | ICD-10-CM

## 2017-04-16 DIAGNOSIS — M25611 Stiffness of right shoulder, not elsewhere classified: Secondary | ICD-10-CM

## 2017-04-16 NOTE — Therapy (Signed)
[facility] [place], [place], [address] Phone: [phone]   Fax:  [phone]  Physical Therapy Treatment  Patient Details  Name: Earl Martinez MRN: [id number] Date of Birth: May 09, 1961 Referring Provider: Hart Robinsons MD  Encounter Date: 04/16/2017      PT End of Session - 04/16/17 1620    Visit Number 19   Number of Visits 24   PT Start Time 1600   PT Stop Time 1708   PT Time Calculation (min) 68 min      Past Medical History:  Diagnosis Date  . Hyperlipemia   . Neck pain   . Rectal abscess   . Seasonal allergies     Past Surgical History:  Procedure Laterality Date  . BACK SURGERY    . NASAL SEPTUM SURGERY    . NECK SURGERY    . RECTAL SURGERY  09/19/11   rectal abscess  . ROTATOR CUFF REPAIR Right 02/19/2017   Dr. Theda Sers, [place]    There were no vitals filed for this visit.      Subjective Assessment - 04/16/17 1616    Subjective Went to MD yesterday and was pleased with staus, but would like to see more ROM.   Patient Stated Goals Use right arm normally again.   Currently in Pain? Yes   Pain Score 4    Pain Location Shoulder   Pain Orientation Right   Pain Descriptors / Indicators Sore   Pain Type Surgical pain   Pain Onset More than a month ago   Pain Frequency Intermittent                         [facility] Adult PT Treatment/Exercise - 04/16/17 0001      Elbow Exercises   Elbow Flexion Strengthening;Right;Standing  2#s 3x 20     Shoulder Exercises: Supine   Flexion AROM;Right;10 reps;20 reps  3 x 10     Shoulder Exercises: Prone   Extension AROM;20 reps  ROW x 20   Horizontal ABduction 1 Right;20 reps  to 45 degrees     Shoulder Exercises: Sidelying   External Rotation AROM;10 reps;Right;20 reps  3x 10     Shoulder Exercises: Pulleys   Flexion Other (comment)  5 min   Other Pulley Exercises standing UE ranger for elevation and circles x 5 mins      Modalities   Modalities Electrical Stimulation;Vasopneumatic     Electrical Stimulation   Electrical Stimulation Location Right shoulder. Premod x 15 mins 80-150hz    Electrical Stimulation Goals Edema;Pain     Vasopneumatic   Number Minutes Vasopneumatic  15 minutes   Vasopnuematic Location  Shoulder   Vasopneumatic Pressure Medium   Vasopneumatic Temperature  34     Manual Therapy   Manual Therapy Passive ROM   Passive ROM PROM of R shoulder into flex/ER/IR with holds at end range                     PT Long Term Goals - 04/10/17 0855      PT LONG TERM GOAL #1   Title Independent with a HEP.   Time 4   Period Weeks   Status Achieved     PT LONG TERM GOAL #2   Title Active right shoulder flexion to 145 degrees so the patient can easily reach overhead   Period Weeks   Status On-going     PT LONG TERM GOAL #  3   Title Active ER to 70 degrees+ to allow for easily donning/doffing of apparel   Time 4   Period Weeks   Status On-going     PT LONG TERM GOAL #4   Title Increase ROM so patient is able to reach behind back   Time 4   Period Weeks     PT LONG TERM GOAL #5   Title Perform ADL's with pain not > 3/10.   Time 4   Period Weeks   Status On-going               Plan - 04/16/17 1710    Clinical Impression Statement Pt reports MD F/U yesterday and he wants him to continue as per protocol and work on ROM. Pt is 8 weeks P/O now and did well with AROM exs and resisted elbow flexion. He is still unable to raise RT arm above 90 degrees without shldr hiking., but did well with supine flexion AROM.   Rehab Potential Excellent   Clinical Impairments Affecting Rehab Potential surgury 02/19/17 / current 8 weeks 04/16/17   PT Frequency 3x / week   PT Duration 4 weeks   PT Treatment/Interventions ADLs/Self Care Home Management;Cryotherapy;Electrical Stimulation;Ultrasound;Moist Heat;Therapeutic activities;Therapeutic exercise;Neuromuscular  re-education;Patient/family education;Passive range of motion;Manual techniques;Vasopneumatic Device   PT Next Visit Plan cont with POC per protocol (see media tab).  Modalites PRN (MD. 04/13/17)                 PT Home Exercise Plan ER in supine with cane, putty hand/wrist exercises   Consulted and Agree with Plan of Care Patient      Patient will benefit from skilled therapeutic intervention in order to improve the following deficits and impairments:  Pain, Decreased activity tolerance, Decreased range of motion  Visit Diagnosis: Acute pain of right shoulder  Muscle weakness (generalized)  Stiffness of right shoulder, not elsewhere classified     Problem List Patient Active Problem List   Diagnosis Date Noted  . Constipation 08/07/2016  . Hyperlipidemia 03/25/2011  . Seasonal allergies     Bricen Victory,CHRIS, PTA 04/16/2017, 5:21 PM  [facility] [address] [place], [place], [address] Phone: [phone]   Fax:  [phone]  Name: Earl Martinez MRN: [id number] Date of Birth: [birth year]

## 2017-04-17 ENCOUNTER — Encounter: Payer: 59 | Admitting: Physical Therapy

## 2017-04-20 ENCOUNTER — Encounter: Payer: Self-pay | Admitting: Physical Therapy

## 2017-04-20 ENCOUNTER — Ambulatory Visit: Payer: Managed Care, Other (non HMO) | Admitting: Physical Therapy

## 2017-04-20 DIAGNOSIS — M25611 Stiffness of right shoulder, not elsewhere classified: Secondary | ICD-10-CM

## 2017-04-20 DIAGNOSIS — M6281 Muscle weakness (generalized): Secondary | ICD-10-CM

## 2017-04-20 DIAGNOSIS — M25511 Pain in right shoulder: Secondary | ICD-10-CM | POA: Diagnosis not present

## 2017-04-20 NOTE — Therapy (Signed)
[facility] [place], [place], [address] Phone: [phone]   Fax:  [phone]  Physical Therapy Treatment  Patient Details  Name: Earl Martinez MRN: [id number] Date of Birth: Nov 08, 1961 Referring Provider: Hart Robinsons MD  Encounter Date: 04/20/2017      PT End of Session - 04/20/17 1018    Visit Number 20   Number of Visits 24   Date for PT Re-Evaluation 04/13/17   PT Start Time 0946   PT Stop Time 1042   PT Time Calculation (min) 56 min   Activity Tolerance Patient tolerated treatment well   Behavior During Therapy [facility] for tasks assessed/performed      Past Medical History:  Diagnosis Date  . Hyperlipemia   . Neck pain   . Rectal abscess   . Seasonal allergies     Past Surgical History:  Procedure Laterality Date  . BACK SURGERY    . NASAL SEPTUM SURGERY    . NECK SURGERY    . RECTAL SURGERY  09/19/11   rectal abscess  . ROTATOR CUFF REPAIR Right 02/19/2017   Dr. Theda Sers, [place]    There were no vitals filed for this visit.      Subjective Assessment - 04/20/17 0948    Subjective Patient tolerated treatment well last visit   Patient Stated Goals Use right arm normally again.   Pain Score 3    Pain Location Shoulder   Pain Orientation Right   Pain Descriptors / Indicators Sore;Tightness   Pain Type Surgical pain   Pain Onset More than a month ago   Pain Frequency Intermittent   Aggravating Factors  certain movements   Pain Relieving Factors at rest                         [facility] Adult PT Treatment/Exercise - 04/20/17 0001      Shoulder Exercises: Pulleys   Flexion Other (comment)  52min   Other Pulley Exercises standing UE ranger for elevation and ER 2x10   Other Pulley Exercises wall walk up to #28 x29min     Electrical Stimulation   Electrical Stimulation Location Right shoulder. Premod x 15 mins 80-150hz    Electrical Stimulation Goals Edema;Pain     Vasopneumatic   Number Minutes Vasopneumatic  15 minutes   Vasopnuematic Location  Shoulder   Vasopneumatic Pressure Medium     Manual Therapy   Manual Therapy Passive ROM   Passive ROM PROM of R shoulder into flex/ER/IR with holds at end range                     PT Long Term Goals - 04/10/17 0855      PT LONG TERM GOAL #1   Title Independent with a HEP.   Time 4   Period Weeks   Status Achieved     PT LONG TERM GOAL #2   Title Active right shoulder flexion to 145 degrees so the patient can easily reach overhead   Period Weeks   Status On-going     PT LONG TERM GOAL #3   Title Active ER to 70 degrees+ to allow for easily donning/doffing of apparel   Time 4   Period Weeks   Status On-going     PT LONG TERM GOAL #4   Title Increase ROM so patient is able to reach behind back   Time 4   Period Weeks     PT  LONG TERM GOAL #5   Title Perform ADL's with pain not > 3/10.   Time 4   Period Weeks   Status On-going               Plan - 04/20/17 1019    Clinical Impression Statement Patient tolerated treatment well today. Patient able to progress with AAROM and manual ROM. Patient continues to have tightness esp with ER yey able to increease Range after stretching. Goals ongoing due to ROM and strength deficts.   Rehab Potential Excellent   Clinical Impairments Affecting Rehab Potential surgury 02/19/17 / current 8 weeks 04/16/17   PT Frequency 3x / week   PT Duration 4 weeks   PT Treatment/Interventions ADLs/Self Care Home Management;Cryotherapy;Electrical Stimulation;Ultrasound;Moist Heat;Therapeutic activities;Therapeutic exercise;Neuromuscular re-education;Patient/family education;Passive range of motion;Manual techniques;Vasopneumatic Device   PT Next Visit Plan cont with POC per protocol (see media tab).  Modalites PRN            Patient will benefit from skilled therapeutic intervention in order to improve the following deficits and impairments:   Pain, Decreased activity tolerance, Decreased range of motion  Visit Diagnosis: Acute pain of right shoulder  Muscle weakness (generalized)  Stiffness of right shoulder, not elsewhere classified     Problem List Patient Active Problem List   Diagnosis Date Noted  . Constipation 08/07/2016  . Hyperlipidemia 03/25/2011  . Seasonal allergies     Riah Kehoe P, PTA 04/20/2017, 11:48 AM  [facility] [place], [place], [address] Phone: [phone]   Fax:  [phone]  Name: Earl Martinez MRN: [id number] Date of Birth: 02-26-1961

## 2017-04-20 NOTE — Addendum Note (Signed)
Addended by: Teliah Buffalo, [place] W on: 04/20/2017 01:21 PM   Modules accepted: Orders

## 2017-04-22 ENCOUNTER — Ambulatory Visit: Payer: Managed Care, Other (non HMO) | Admitting: Physical Therapy

## 2017-04-22 DIAGNOSIS — M25511 Pain in right shoulder: Secondary | ICD-10-CM

## 2017-04-22 DIAGNOSIS — M25611 Stiffness of right shoulder, not elsewhere classified: Secondary | ICD-10-CM

## 2017-04-22 DIAGNOSIS — M6281 Muscle weakness (generalized): Secondary | ICD-10-CM

## 2017-04-22 NOTE — Therapy (Signed)
[facility] [place], [place], [address] Phone: [phone]   Fax:  [phone]  Physical Therapy Treatment  Patient Details  Name: CLEAVEN DEMARIO MRN: [id number] Date of Birth: 02/08/1961 Referring Provider: Hart Robinsons MD  Encounter Date: 04/22/2017      PT End of Session - 04/22/17 0901    Visit Number 21   Number of Visits 24   Date for PT Re-Evaluation 04/13/17   PT Start Time 0902   PT Stop Time 0957   PT Time Calculation (min) 55 min   Activity Tolerance Patient tolerated treatment well   Behavior During Therapy [facility] for tasks assessed/performed      Past Medical History:  Diagnosis Date  . Hyperlipemia   . Neck pain   . Rectal abscess   . Seasonal allergies     Past Surgical History:  Procedure Laterality Date  . BACK SURGERY    . NASAL SEPTUM SURGERY    . NECK SURGERY    . RECTAL SURGERY  09/19/11   rectal abscess  . ROTATOR CUFF REPAIR Right 02/19/2017   Dr. Theda Sers, [place]    There were no vitals filed for this visit.      Subjective Assessment - 04/22/17 0906    Patient Stated Goals Use right arm normally again.   Pain Score 4    Pain Onset More than a month ago                         [facility] Adult PT Treatment/Exercise - 04/22/17 0001      Shoulder Exercises: Pulleys   Flexion Limitations 5 minutes.   Other Pulley Exercises UE Ranger on wall x 5 minutes.   Other Pulley Exercises Wall ladder x 5 minutes.     Acupuncturist Location Right shoulder.   Electrical Stimulation Action Pre-mod on constant.   Electrical Stimulation Parameters 80-150 Hz x 15 minutes.   Electrical Stimulation Goals Pain     Manual Therapy   Manual Therapy Passive ROM   Passive ROM PROM of right shoulder in plane of scapula into ER x 15 minutes low load long duration stretching.                PT Education - 04/22/17 1203    Education  provided Yes   Person(s) Educated Patient   Methods Explanation;Demonstration;Tactile cues;Verbal cues;Handout   Comprehension Verbalized understanding;Returned demonstration             PT Long Term Goals - 04/10/17 0855      PT LONG TERM GOAL #1   Title Independent with a HEP.   Time 4   Period Weeks   Status Achieved     PT LONG TERM GOAL #2   Title Active right shoulder flexion to 145 degrees so the patient can easily reach overhead   Period Weeks   Status On-going     PT LONG TERM GOAL #3   Title Active ER to 70 degrees+ to allow for easily donning/doffing of apparel   Time 4   Period Weeks   Status On-going     PT LONG TERM GOAL #4   Title Increase ROM so patient is able to reach behind back   Time 4   Period Weeks     PT LONG TERM GOAL #5   Title Perform ADL's with pain not > 3/10.   Time 4   Period Weeks  Status On-going               Plan - 04/22/17 1204    Clinical Impression Statement Patient did well today but needscontinued work on right shoulder range of motion.  Instructed patient in a low load long duration stretch to improve ER.   Consulted and Agree with Plan of Care Patient      Patient will benefit from skilled therapeutic intervention in order to improve the following deficits and impairments:  Pain, Decreased activity tolerance, Decreased range of motion  Visit Diagnosis: Acute pain of right shoulder  Stiffness of right shoulder, not elsewhere classified  Muscle weakness (generalized)     Problem List Patient Active Problem List   Diagnosis Date Noted  . Constipation 08/07/2016  . Hyperlipidemia 03/25/2011  . Seasonal allergies     Theoplis Garciagarcia, [place]  MPT 04/22/2017, 12:06 PM  [facility] [address] [place], [place], [address] Phone: [phone]   Fax:  [phone]  Name: MEGHAN TIEMANN MRN: [id number] Date of Birth: Nov 27, 1961

## 2017-04-22 NOTE — Patient Instructions (Signed)
Instructed patient in supine ER stretch in plane of scapula with 1#.

## 2017-04-24 ENCOUNTER — Encounter: Payer: Self-pay | Admitting: Physical Therapy

## 2017-04-24 ENCOUNTER — Ambulatory Visit: Payer: Managed Care, Other (non HMO) | Admitting: Physical Therapy

## 2017-04-24 DIAGNOSIS — M25511 Pain in right shoulder: Secondary | ICD-10-CM

## 2017-04-24 DIAGNOSIS — M6281 Muscle weakness (generalized): Secondary | ICD-10-CM

## 2017-04-24 DIAGNOSIS — M25611 Stiffness of right shoulder, not elsewhere classified: Secondary | ICD-10-CM

## 2017-04-24 NOTE — Therapy (Signed)
[facility] [place], [place], [address] Phone: [phone]   Fax:  [phone]  Physical Therapy Treatment  Patient Details  Name: Earl Martinez MRN: [id number] Date of Birth: [birth year] Referring Provider: Hart Robinsons MD  Encounter Date: 04/24/2017      PT End of Session - 04/24/17 1125    Visit Number 22   Number of Visits 24   Date for PT Re-Evaluation 04/13/17   PT Start Time 1119   PT Stop Time 1219   PT Time Calculation (min) 60 min   Activity Tolerance Patient tolerated treatment well   Behavior During Therapy [facility] for tasks assessed/performed      Past Medical History:  Diagnosis Date  . Hyperlipemia   . Neck pain   . Rectal abscess   . Seasonal allergies     Past Surgical History:  Procedure Laterality Date  . BACK SURGERY    . NASAL SEPTUM SURGERY    . NECK SURGERY    . RECTAL SURGERY  09/19/11   rectal abscess  . ROTATOR CUFF REPAIR Right 02/19/2017   Dr. Theda Sers, [place]    There were no vitals filed for this visit.      Subjective Assessment - 04/24/17 1123    Subjective Reports that he has been using half empty peanut butter jar for exercises at home. Reports that he only takes hydrocodone every once in a while.   Patient Stated Goals Use right arm normally again.   Currently in Pain? Yes   Pain Score 3    Pain Location Shoulder   Pain Orientation Right   Pain Descriptors / Indicators Sore;Tender   Pain Type Surgical pain   Pain Onset More than a month ago            [facility] PT Assessment - 04/24/17 0001      Assessment   Medical Diagnosis Right shoulder SAD;DCR and RCR.   Onset Date/Surgical Date 02/19/17   Next MD Visit 05/20/2017     Restrictions   Weight Bearing Restrictions No     ROM / Strength   AROM / PROM / Strength AROM     AROM   Overall AROM  Deficits   AROM Assessment Site Shoulder   Right/Left Shoulder Right   Right Shoulder Flexion 140  Degrees   Right Shoulder External Rotation 50 Degrees     PROM   Overall PROM  Deficits   PROM Assessment Site Shoulder   Right/Left Shoulder Right   Right Shoulder External Rotation 55 Degrees                     [facility] Adult PT Treatment/Exercise - 04/24/17 0001      Shoulder Exercises: Supine   External Rotation AAROM;Right;20 reps   Flexion AROM;AAROM;Both;10 reps;20 reps  x10 reps with thoracic ext; x20 reps AAROM     Shoulder Exercises: Pulleys   Flexion Other (comment)  x5 min   Other Pulley Exercises UE Ranger on wall x 5 minutes.   Other Pulley Exercises Wall ladder x10 reps to 32     Shoulder Exercises: ROM/Strengthening   Rhythmic Stabilization, Supine ER at 20 deg abd and 45 deg abd, flex at 60 deg and 90 deg     Modalities   Modalities Electrical Stimulation;Vasopneumatic     Electrical Stimulation   Electrical Stimulation Location R shoulder   Electrical Stimulation Action Pre-Mod   Electrical Stimulation Parameters 80-150 hz x15 min  Electrical Stimulation Goals Pain     Vasopneumatic   Number Minutes Vasopneumatic  15 minutes   Vasopnuematic Location  Shoulder   Vasopneumatic Pressure Medium   Vasopneumatic Temperature  34     Manual Therapy   Manual Therapy Passive ROM   Passive ROM PROM of R shoulder into flex/ER with holds at end range                     PT Long Term Goals - 04/10/17 0855      PT LONG TERM GOAL #1   Title Independent with a HEP.   Time 4   Period Weeks   Status Achieved     PT LONG TERM GOAL #2   Title Active right shoulder flexion to 145 degrees so the patient can easily reach overhead   Period Weeks   Status On-going     PT LONG TERM GOAL #3   Title Active ER to 70 degrees+ to allow for easily donning/doffing of apparel   Time 4   Period Weeks   Status On-going     PT LONG TERM GOAL #4   Title Increase ROM so patient is able to reach behind back   Time 4   Period Weeks     PT LONG  TERM GOAL #5   Title Perform ADL's with pain not > 3/10.   Time 4   Period Weeks   Status On-going               Plan - 04/24/17 1210    Clinical Impression Statement Patient continues to do well in treatment with low level R shoulder soreness and tenderness but no reports of any increased pain with any exercises. Patient continues to use a minimally weighted object for ER stretch at home per patient report. Thoracic extension utilized today with AROM flexion to promote proper thoracic mobility as well as increased shoulder flexion. Tightness and ROM limitation still noted with PROM of R shoulder ER and further noted with ROM measurements of ER. Patient still tender to palpation over R shoulder anterior incision. Normal modalities response noted following removal of the modalities.   Rehab Potential Excellent   Clinical Impairments Affecting Rehab Potential surgury 02/19/17 / current 8 weeks 04/16/17   PT Frequency 3x / week   PT Duration 4 weeks   PT Treatment/Interventions ADLs/Self Care Home Management;Cryotherapy;Electrical Stimulation;Ultrasound;Moist Heat;Therapeutic activities;Therapeutic exercise;Neuromuscular re-education;Patient/family education;Passive range of motion;Manual techniques;Vasopneumatic Device   PT Next Visit Plan Begin light PREs and shoulder girdle strengthening with continued ROM stretching per MPT POC and [facility] protocol.   PT Home Exercise Plan ER in supine with cane, putty hand/wrist exercises   Consulted and Agree with Plan of Care Patient      Patient will benefit from skilled therapeutic intervention in order to improve the following deficits and impairments:  Pain, Decreased activity tolerance, Decreased range of motion  Visit Diagnosis: Acute pain of right shoulder  Stiffness of right shoulder, not elsewhere classified  Muscle weakness (generalized)     Problem List Patient Active Problem List   Diagnosis Date Noted  . Constipation  08/07/2016  . Hyperlipidemia 03/25/2011  . Seasonal allergies     Wynelle Fanny, PTA 04/24/2017, 12:22 PM  [facility] [address] [place], [place], [address] Phone: [phone]   Fax:  [phone]  Name: Earl Martinez MRN: [id number] Date of Birth: [birth year]

## 2017-04-27 ENCOUNTER — Ambulatory Visit: Payer: Managed Care, Other (non HMO) | Admitting: Physical Therapy

## 2017-04-27 DIAGNOSIS — M25611 Stiffness of right shoulder, not elsewhere classified: Secondary | ICD-10-CM

## 2017-04-27 DIAGNOSIS — M25511 Pain in right shoulder: Secondary | ICD-10-CM

## 2017-04-27 DIAGNOSIS — M6281 Muscle weakness (generalized): Secondary | ICD-10-CM

## 2017-04-27 NOTE — Therapy (Signed)
[facility] [place], [place], [address] Phone: [phone]   Fax:  [phone]  Physical Therapy Treatment  Patient Details  Name: Earl Martinez MRN: [id number] Date of Birth: Dec 04, 1961 Referring Provider: Hart Robinsons MD  Encounter Date: 04/27/2017      PT End of Session - 04/27/17 1128    Visit Number 23   Number of Visits 24   Date for PT Re-Evaluation 04/13/17   PT Start Time 1115   PT Stop Time 1209   PT Time Calculation (min) 54 min   Activity Tolerance Patient tolerated treatment well   Behavior During Therapy [facility] for tasks assessed/performed      Past Medical History:  Diagnosis Date  . Hyperlipemia   . Neck pain   . Rectal abscess   . Seasonal allergies     Past Surgical History:  Procedure Laterality Date  . BACK SURGERY    . NASAL SEPTUM SURGERY    . NECK SURGERY    . RECTAL SURGERY  09/19/11   rectal abscess  . ROTATOR CUFF REPAIR Right 02/19/2017   Dr. Theda Sers, [place]    There were no vitals filed for this visit.      Subjective Assessment - 04/27/17 1128    Subjective I'm working on the can stretch at home.   Patient Stated Goals Use right arm normally again.   Pain Score 3    Pain Location Shoulder   Pain Orientation Right   Pain Descriptors / Indicators Aching;Tender   Pain Onset More than a month ago                         [facility] Adult PT Treatment/Exercise - 04/27/17 0001      Shoulder Exercises: Supine   Other Supine Exercises LLLDS x 10 minutes into right shoulder ER x 10 minutes in plane of scapula with HMP.     Shoulder Exercises: Pulleys   Flexion Limitations 6 minutes.   Other Pulley Exercises UE Ranger on wall into flexion and ER x 6 minutes.     Modalities   Modalities Electrical Stimulation;Moist Heat     Moist Heat Therapy   Number Minutes Moist Heat 15 Minutes   Moist Heat Location --  Right shoulder,     Electrical  Stimulation   Electrical Stimulation Location --  Right shoulder.   Electrical Stimulation Action Pre-mod.   Electrical Stimulation Parameters 80-150 Hz x 15 minutes.   Electrical Stimulation Goals Pain     Manual Therapy   Manual Therapy Passive ROM   Passive ROM PROM to patient's right shoulder x 6 minutes.                     PT Long Term Goals - 04/10/17 0855      PT LONG TERM GOAL #1   Title Independent with a HEP.   Time 4   Period Weeks   Status Achieved     PT LONG TERM GOAL #2   Title Active right shoulder flexion to 145 degrees so the patient can easily reach overhead   Period Weeks   Status On-going     PT LONG TERM GOAL #3   Title Active ER to 70 degrees+ to allow for easily donning/doffing of apparel   Time 4   Period Weeks   Status On-going     PT LONG TERM GOAL #4   Title Increase ROM so patient  is able to reach behind back   Time 4   Period Weeks     PT LONG TERM GOAL #5   Title Perform ADL's with pain not > 3/10.   Time 4   Period Weeks   Status On-going               Plan - 04/27/17 1231    Clinical Impression Statement Excellent job today with low load long duration stretcthing (LLLDS) today.   Consulted and Agree with Plan of Care Patient      Patient will benefit from skilled therapeutic intervention in order to improve the following deficits and impairments:  Pain, Decreased activity tolerance, Decreased range of motion  Visit Diagnosis: Acute pain of right shoulder  Stiffness of right shoulder, not elsewhere classified  Muscle weakness (generalized)     Problem List Patient Active Problem List   Diagnosis Date Noted  . Constipation 08/07/2016  . Hyperlipidemia 03/25/2011  . Seasonal allergies     APPLEGATE, [name] 04/27/2017, 12:35 PM  [facility] [address] [place], [place], [address] Phone: [phone]   Fax:  [phone]  Name: Earl Martinez MRN: [id number] Date of Birth: 03-Apr-1961

## 2017-04-30 ENCOUNTER — Ambulatory Visit: Payer: Managed Care, Other (non HMO) | Admitting: *Deleted

## 2017-04-30 DIAGNOSIS — M25511 Pain in right shoulder: Secondary | ICD-10-CM | POA: Diagnosis not present

## 2017-04-30 DIAGNOSIS — M6281 Muscle weakness (generalized): Secondary | ICD-10-CM

## 2017-04-30 DIAGNOSIS — M25611 Stiffness of right shoulder, not elsewhere classified: Secondary | ICD-10-CM

## 2017-04-30 NOTE — Therapy (Signed)
[facility] [place], [place], [address] Phone: [phone]   Fax:  [phone]  Physical Therapy Treatment  Patient Details  Name: Earl Martinez MRN: [id number] Date of Birth: 1961-03-17 Referring Provider: Hart Robinsons MD  Encounter Date: 04/30/2017      PT End of Session - 04/30/17 1004    Visit Number 24   Number of Visits 24   Date for PT Re-Evaluation 04/13/17   PT Start Time 0948   PT Stop Time 1052   PT Time Calculation (min) 64 min      Past Medical History:  Diagnosis Date  . Hyperlipemia   . Neck pain   . Rectal abscess   . Seasonal allergies     Past Surgical History:  Procedure Laterality Date  . BACK SURGERY    . NASAL SEPTUM SURGERY    . NECK SURGERY    . RECTAL SURGERY  09/19/11   rectal abscess  . ROTATOR CUFF REPAIR Right 02/19/2017   Dr. Theda Sers, [place]    There were no vitals filed for this visit.      Subjective Assessment - 04/30/17 0957    Subjective 3/10 RT shldr   Patient Stated Goals Use right arm normally again.   Currently in Pain? Yes   Pain Score 3    Pain Location Shoulder   Pain Orientation Right   Pain Descriptors / Indicators Aching   Pain Type Surgical pain   Pain Onset More than a month ago                         [facility] Adult PT Treatment/Exercise - 04/30/17 0001      Shoulder Exercises: Pulleys   Flexion Limitations 5 mins   Other Pulley Exercises UE Ranger on wall into flexion and ER x 6 minutes.     Modalities   Modalities Electrical Stimulation;Moist Hotel manager Location R shoulder  PRemod x 15 mins 80-150hz    Electrical Stimulation Goals Pain     Vasopneumatic   Number Minutes Vasopneumatic  15 minutes   Vasopnuematic Location  Shoulder   Vasopneumatic Pressure Medium   Vasopneumatic Temperature  34     Manual Therapy   Manual Therapy Passive ROM   Manual therapy  comments STW and TPR to latissimus dorsi and subscap.   Passive ROM PROM and contract/relax techniques used to increase flexion and ER motion .                     PT Long Term Goals - 04/10/17 0855      PT LONG TERM GOAL #1   Title Independent with a HEP.   Time 4   Period Weeks   Status Achieved     PT LONG TERM GOAL #2   Title Active right shoulder flexion to 145 degrees so the patient can easily reach overhead   Period Weeks   Status On-going     PT LONG TERM GOAL #3   Title Active ER to 70 degrees+ to allow for easily donning/doffing of apparel   Time 4   Period Weeks   Status On-going     PT LONG TERM GOAL #4   Title Increase ROM so patient is able to reach behind back   Time 4   Period Weeks     PT LONG TERM GOAL #5   Title Perform ADL's  with pain not > 3/10.   Time 4   Period Weeks   Status On-going               Plan - 04/30/17 1057    Clinical Impression Statement Pt did fairly well with Rx today. He was a little tight still with elevation and ER so we focused on manual techniques with STW and contract/ relax to facilitate ROM. PROM f;lexio to 165 degrees and ER to 65 degrees in scaption.   Rehab Potential Excellent   Clinical Impairments Affecting Rehab Potential surgury 02/19/17 / current 9 weeks 04/23/17   PT Frequency 3x / week   PT Duration 4 weeks   PT Treatment/Interventions ADLs/Self Care Home Management;Cryotherapy;Electrical Stimulation;Ultrasound;Moist Heat;Therapeutic activities;Therapeutic exercise;Neuromuscular re-education;Patient/family education;Passive range of motion;Manual techniques;Vasopneumatic Device   PT Next Visit Plan Begin light PREs and shoulder girdle strengthening with continued ROM stretching per MPT POC and [facility] protocol.   Start Tband RW 4   PT Home Exercise Plan ER in supine with cane, putty hand/wrist exercises   Consulted and Agree with Plan of Care Patient      Patient will benefit from skilled  therapeutic intervention in order to improve the following deficits and impairments:  Pain, Decreased activity tolerance, Decreased range of motion  Visit Diagnosis: Acute pain of right shoulder  Stiffness of right shoulder, not elsewhere classified  Muscle weakness (generalized)     Problem List Patient Active Problem List   Diagnosis Date Noted  . Constipation 08/07/2016  . Hyperlipidemia 03/25/2011  . Seasonal allergies     RAMSEUR,CHRIS 04/30/2017, 11:07 AM  [facility] [address] [place], [place], [address] Phone: [phone]   Fax:  [phone]  Name: Earl Martinez MRN: [id number] Date of Birth: [birth year]

## 2017-05-05 ENCOUNTER — Ambulatory Visit: Payer: Managed Care, Other (non HMO) | Admitting: Physical Therapy

## 2017-05-05 DIAGNOSIS — M25511 Pain in right shoulder: Secondary | ICD-10-CM

## 2017-05-05 DIAGNOSIS — M6281 Muscle weakness (generalized): Secondary | ICD-10-CM

## 2017-05-05 DIAGNOSIS — M25611 Stiffness of right shoulder, not elsewhere classified: Secondary | ICD-10-CM

## 2017-05-05 NOTE — Therapy (Signed)
[facility] [place], [place], [address] Phone: [phone]   Fax:  [phone]  Physical Therapy Treatment  Patient Details  Name: Earl Martinez MRN: [id number] Date of Birth: 07/22/1961 Referring Provider: Hart Robinsons MD  Encounter Date: 05/05/2017      PT End of Session - 05/05/17 1033    Visit Number 25   Number of Visits 36   Date for PT Re-Evaluation 05/25/17   PT Start Time 0953   PT Stop Time (P)  1041   PT Time Calculation (min) (P)  48 min   Activity Tolerance Patient tolerated treatment well   Behavior During Therapy [facility] for tasks assessed/performed      Past Medical History:  Diagnosis Date  . Hyperlipemia   . Neck pain   . Rectal abscess   . Seasonal allergies     Past Surgical History:  Procedure Laterality Date  . BACK SURGERY    . NASAL SEPTUM SURGERY    . NECK SURGERY    . RECTAL SURGERY  09/19/11   rectal abscess  . ROTATOR CUFF REPAIR Right 02/19/2017   Dr. Theda Sers, [place]    There were no vitals filed for this visit.      Subjective Assessment - 05/05/17 1034    Subjective I'm doing well.  Got to clinic and then realized I left my phone at [employer].                         [facility] Adult PT Treatment/Exercise - 05/05/17 0001      Exercises   Exercises Shoulder     Shoulder Exercises: Standing   Other Standing Exercises Doorway stretch 4 reps x 30 seconds f/b Towel stretch 4 reps x 30 seconds.     Shoulder Exercises: Pulleys   Flexion Limitations 5 minutes.   Other Pulley Exercises UE ranger into flexion and ER x 5 minutes.     Modalities   Modalities Chiropractor Stimulation Location Right shoulder.   Electrical Stimulation Action Pre-mod.   Electrical Stimulation Parameters 80-150 hz x 15 minutes.   Electrical Stimulation Goals Pain     Manual Therapy   Manual Therapy Passive ROM   Manual  therapy comments Sustained right ER stretch while performing TP release to right pect major x 5 minutes.                     PT Long Term Goals - 04/10/17 0855      PT LONG TERM GOAL #1   Title Independent with a HEP.   Time 4   Period Weeks   Status Achieved     PT LONG TERM GOAL #2   Title Active right shoulder flexion to 145 degrees so the patient can easily reach overhead   Period Weeks   Status On-going     PT LONG TERM GOAL #3   Title Active ER to 70 degrees+ to allow for easily donning/doffing of apparel   Time 4   Period Weeks   Status On-going     PT LONG TERM GOAL #4   Title Increase ROM so patient is able to reach behind back   Time 4   Period Weeks     PT LONG TERM GOAL #5   Title Perform ADL's with pain not > 3/10.   Time 4   Period Weeks   Status  On-going               Plan - 05/05/17 1035    Clinical Impression Statement The patient is doing well but needs continued work to increase his right shoulder range of motion.      Patient will benefit from skilled therapeutic intervention in order to improve the following deficits and impairments:     Visit Diagnosis: Acute pain of right shoulder  Stiffness of right shoulder, not elsewhere classified  Muscle weakness (generalized)     Problem List Patient Active Problem List   Diagnosis Date Noted  . Constipation 08/07/2016  . Hyperlipidemia 03/25/2011  . Seasonal allergies     Idriss Quackenbush, [name] 05/05/2017, 10:53 AM  [facility] [address] [place], [place], [address] Phone: [phone]   Fax:  [phone]  Name: Earl Martinez MRN: [id number] Date of Birth: April 01, 1961

## 2017-05-08 ENCOUNTER — Ambulatory Visit: Payer: Managed Care, Other (non HMO) | Attending: Specialist | Admitting: Physical Therapy

## 2017-05-08 DIAGNOSIS — M25511 Pain in right shoulder: Secondary | ICD-10-CM

## 2017-05-08 DIAGNOSIS — M6281 Muscle weakness (generalized): Secondary | ICD-10-CM | POA: Diagnosis present

## 2017-05-08 DIAGNOSIS — M25611 Stiffness of right shoulder, not elsewhere classified: Secondary | ICD-10-CM

## 2017-05-08 NOTE — Therapy (Signed)
[facility] [place], [place], [address] Phone: [phone]   Fax:  [phone]  Physical Therapy Treatment  Patient Details  Name: Earl Martinez MRN: [id number] Date of Birth: 1961-11-23 Referring Provider: Hart Robinsons MD  Encounter Date: 05/08/2017      PT End of Session - 05/08/17 0903    Visit Number 26   Number of Visits 36   Date for PT Re-Evaluation 05/25/17   PT Start Time 0900   PT Stop Time 1001   PT Time Calculation (min) 61 min   Activity Tolerance Patient tolerated treatment well   Behavior During Therapy [facility] for tasks assessed/performed      Past Medical History:  Diagnosis Date  . Hyperlipemia   . Neck pain   . Rectal abscess   . Seasonal allergies     Past Surgical History:  Procedure Laterality Date  . BACK SURGERY    . NASAL SEPTUM SURGERY    . NECK SURGERY    . RECTAL SURGERY  09/19/11   rectal abscess  . ROTATOR CUFF REPAIR Right 02/19/2017   Dr. Theda Sers, [place]    There were no vitals filed for this visit.      Subjective Assessment - 05/08/17 0903    Subjective Patient states he is hoping to go back to work after his next MD appt.   Patient Stated Goals Use right arm normally again.   Currently in Pain? Yes   Pain Score 2    Pain Location Shoulder   Pain Orientation Right   Pain Descriptors / Indicators Aching   Pain Type Surgical pain   Pain Onset More than a month ago   Pain Frequency Intermittent   Aggravating Factors  stretching   Pain Relieving Factors rest   Effect of Pain on Daily Activities unable to work            [facility] PT Assessment - 05/08/17 0001      AROM   Overall AROM  Deficits   AROM Assessment Site Shoulder   Right/Left Shoulder Right   Right Shoulder Flexion 143 Degrees   Right Shoulder Internal Rotation --  thumb to L4   Right Shoulder External Rotation 54 Degrees     PROM   Overall PROM  Deficits   PROM Assessment Site  Shoulder   Right/Left Shoulder Right   Right Shoulder Flexion 157 Degrees  seated   Right Shoulder External Rotation 64 Degrees                     [facility] Adult PT Treatment/Exercise - 05/08/17 0001      Shoulder Exercises: Supine   Other Supine Exercises active IR/ER with TPR to subscap x 20 reps     Shoulder Exercises: Prone   Extension Strengthening;Right;20 reps   Horizontal ABduction 1 Strengthening;Right;20 reps  T   Horizontal ABduction 2 Strengthening;Right;20 reps  Y   Other Prone Exercises Row x 20     Shoulder Exercises: Sidelying   External Rotation Strengthening;Right;20 reps   Flexion Strengthening;Right;20 reps  to shoulder height   Other Sidelying Exercises scaption x 20 to shoulder height     Shoulder Exercises: Standing   Internal Rotation AAROM;Both;20 reps     Shoulder Exercises: Pulleys   Flexion Limitations 5 minutes.   Other Pulley Exercises UE ranger into flexion, CCW and ER x 6 minutes.     Shoulder Exercises: Stretch   Internal Rotation Stretch 2 reps  20 sec using bolster behind back     Modalities   Modalities Chiropractor Stimulation Location Right shoulder.   Electrical Stimulation Action premod   Electrical Stimulation Parameters 80-150 Hz x 15 min   Electrical Stimulation Goals Pain                     PT Long Term Goals - 05/08/17 0951      PT LONG TERM GOAL #1   Title Independent with a HEP.   Time 4   Period Weeks   Status Achieved     PT LONG TERM GOAL #2   Title Active right shoulder flexion to 145 degrees so the patient can easily reach overhead   Baseline 143 as of 05/08/17   Time 4   Period Weeks   Status On-going     PT LONG TERM GOAL #3   Title Active ER to 70 degrees+ to allow for easily donning/doffing of apparel   Baseline 54 deg as of 05/08/17   Time 4   Period Weeks   Status On-going     PT LONG TERM GOAL #4   Title Increase ROM  so patient is able to reach behind back   Baseline able to reach to L4   Time 4   Period Weeks   Status Achieved               Plan - 05/08/17 9166    Clinical Impression Statement Patient continues to do well with ROM and tolerated strengthening well today.   Rehab Potential Excellent   Clinical Impairments Affecting Rehab Potential surgury 02/19/17 / current 11 weeks 05/08/17   PT Frequency 3x / week   PT Duration 4 weeks   PT Treatment/Interventions ADLs/Self Care Home Management;Cryotherapy;Electrical Stimulation;Ultrasound;Moist Heat;Therapeutic activities;Therapeutic exercise;Neuromuscular re-education;Patient/family education;Passive range of motion;Manual techniques;Vasopneumatic Device   PT Next Visit Plan Start Tband RW 4; continue light PREs and shoulder girdle strengthening with continued ROM stretching per MPT POC and [facility] protocol.      PT Home Exercise Plan ER in supine with cane, putty hand/wrist exercises      Patient will benefit from skilled therapeutic intervention in order to improve the following deficits and impairments:  Pain, Decreased activity tolerance, Decreased range of motion  Visit Diagnosis: Stiffness of right shoulder, not elsewhere classified  Acute pain of right shoulder  Muscle weakness (generalized)     Problem List Patient Active Problem List   Diagnosis Date Noted  . Constipation 08/07/2016  . Hyperlipidemia 03/25/2011  . Seasonal allergies    Madelyn Flavors PT 05/08/2017, 9:56 AM  [facility] [address] [place], [place], [address] Phone: [phone]   Fax:  [phone]  Name: Earl Martinez MRN: [id number] Date of Birth: Aug 28, 1961

## 2017-05-11 ENCOUNTER — Ambulatory Visit: Payer: Managed Care, Other (non HMO) | Admitting: Physical Therapy

## 2017-05-11 ENCOUNTER — Encounter: Payer: Self-pay | Admitting: Physical Therapy

## 2017-05-11 DIAGNOSIS — M25611 Stiffness of right shoulder, not elsewhere classified: Secondary | ICD-10-CM | POA: Diagnosis not present

## 2017-05-11 DIAGNOSIS — M6281 Muscle weakness (generalized): Secondary | ICD-10-CM

## 2017-05-11 DIAGNOSIS — M25511 Pain in right shoulder: Secondary | ICD-10-CM

## 2017-05-11 NOTE — Therapy (Signed)
[facility] [place], [place], [address] Phone: [phone]   Fax:  [phone]  Physical Therapy Treatment  Patient Details  Name: Earl Martinez MRN: [id number] Date of Birth: October 11, 1961 Referring Provider: Hart Robinsons MD  Encounter Date: 05/11/2017      PT End of Session - 05/11/17 1347    Visit Number 27   Number of Visits 36   Date for PT Re-Evaluation 05/25/17   PT Start Time 1314   PT Stop Time 1409   PT Time Calculation (min) 55 min   Activity Tolerance Patient tolerated treatment well   Behavior During Therapy [facility] for tasks assessed/performed      Past Medical History:  Diagnosis Date  . Hyperlipemia   . Neck pain   . Rectal abscess   . Seasonal allergies     Past Surgical History:  Procedure Laterality Date  . BACK SURGERY    . NASAL SEPTUM SURGERY    . NECK SURGERY    . RECTAL SURGERY  09/19/11   rectal abscess  . ROTATOR CUFF REPAIR Right 02/19/2017   Dr. Theda Sers, [place]    There were no vitals filed for this visit.      Subjective Assessment - 05/11/17 1317    Subjective Patient reported feeling improvement and some ongoing soreness   Patient Stated Goals Use right arm normally again.   Currently in Pain? Yes   Pain Score 2    Pain Location Shoulder   Pain Orientation Right   Pain Descriptors / Indicators Aching   Pain Type Surgical pain   Pain Onset More than a month ago   Pain Frequency Intermittent   Aggravating Factors  stretching   Pain Relieving Factors rest                         [facility] Adult PT Treatment/Exercise - 05/11/17 0001      Shoulder Exercises: Prone   Retraction Strengthening;Right;Weights;20 reps   Retraction Weight (lbs) 2   Extension Strengthening;Right;20 reps;Weights   Extension Weight (lbs) 2   Other Prone Exercises scaption 2x10 no weight     Shoulder Exercises: Sidelying   External Rotation Strengthening;Right;20 reps   External Rotation Weight (lbs) 2     Shoulder Exercises: Standing   Protraction Strengthening;Right;Theraband  2x20   Theraband Level (Shoulder Protraction) Level 1 (Yellow)   External Rotation Strengthening;Right;Theraband  2x20   Theraband Level (Shoulder External Rotation) Level 1 (Yellow)   Internal Rotation Strengthening;Right;Theraband  2x20   Theraband Level (Shoulder Internal Rotation) Level 1 (Yellow)   Row Strengthening;Right;Theraband  2x10   Other Standing Exercises scap retraction red tband x30     Shoulder Exercises: Pulleys   Flexion 3 minutes     Electrical Stimulation   Electrical Stimulation Location Right shoulder.   Water quality scientist Parameters 80-150hz  x29min   Electrical Stimulation Goals Pain     Vasopneumatic   Number Minutes Vasopneumatic  15 minutes   Vasopnuematic Location  Shoulder   Vasopneumatic Pressure Medium     Manual Therapy   Manual Therapy Passive ROM   Passive ROM PROM for ER with low load holds                     PT Long Term Goals - 05/11/17 1353      PT LONG TERM GOAL #1   Title Independent with a HEP.   Time  4   Period Weeks   Status Achieved     PT LONG TERM GOAL #2   Title Active right shoulder flexion to 145 degrees so the patient can easily reach overhead   Baseline 143 as of 05/08/17   Time 4   Period Weeks   Status Achieved     PT LONG TERM GOAL #3   Title Active ER to 70 degrees+ to allow for easily donning/doffing of apparel   Baseline 54 deg as of 05/08/17   Time 4   Period Weeks   Status On-going  AROM 60 degrees 05/11/17     PT LONG TERM GOAL #4   Title Increase ROM so patient is able to reach behind back   Baseline able to reach to L4   Time 4   Period Weeks   Status Achieved     PT LONG TERM GOAL #5   Title Perform ADL's with pain not > 3/10.   Time 4   Period Weeks   Status On-going               Plan - 05/11/17 1354    Clinical  Impression Statement Patient tolerated treatment well today. Patient able to progress with PRE's with good technique and no difficulty. Patient has improved right shoulder flexion and ER today. Patient able to meet LTG #2 today. Patient has some ongoing soreness with increased activity. Remaining goals ongoing due to ER defict and strength limitations.    Clinical Impairments Affecting Rehab Potential surgury 02/19/17 / current 11 weeks 05/08/17   PT Frequency 3x / week   PT Duration 4 weeks   PT Treatment/Interventions ADLs/Self Care Home Management;Cryotherapy;Electrical Stimulation;Ultrasound;Moist Heat;Therapeutic activities;Therapeutic exercise;Neuromuscular re-education;Patient/family education;Passive range of motion;Manual techniques;Vasopneumatic Device   PT Next Visit Plan continue light PREs and shoulder girdle strengthening with continued ROM stretching per MPT POC and [facility] protocol.   (MD. Theda Sers 05/20/17)   Consulted and Agree with Plan of Care Patient      Patient will benefit from skilled therapeutic intervention in order to improve the following deficits and impairments:  Pain, Decreased activity tolerance, Decreased range of motion  Visit Diagnosis: Acute pain of right shoulder  Stiffness of right shoulder, not elsewhere classified  Muscle weakness (generalized)     Problem List Patient Active Problem List   Diagnosis Date Noted  . Constipation 08/07/2016  . Hyperlipidemia 03/25/2011  . Seasonal allergies     Dru Laurel P, PTA 05/11/2017, 2:12 PM  [facility] [place], [place], [address] Phone: [phone]   Fax:  [phone]  Name: Earl Martinez MRN: [id number] Date of Birth: [birth year]

## 2017-05-14 ENCOUNTER — Ambulatory Visit: Payer: Managed Care, Other (non HMO) | Admitting: Physical Therapy

## 2017-05-14 ENCOUNTER — Encounter: Payer: Self-pay | Admitting: Physical Therapy

## 2017-05-14 DIAGNOSIS — M25611 Stiffness of right shoulder, not elsewhere classified: Secondary | ICD-10-CM | POA: Diagnosis not present

## 2017-05-14 DIAGNOSIS — M6281 Muscle weakness (generalized): Secondary | ICD-10-CM

## 2017-05-14 DIAGNOSIS — M25511 Pain in right shoulder: Secondary | ICD-10-CM

## 2017-05-14 NOTE — Therapy (Signed)
[facility] [place], [place], [address] Phone: [phone]   Fax:  [phone]  Physical Therapy Treatment  Patient Details  Name: Earl Martinez MRN: [id number] Date of Birth: 12-Aug-1961 Referring Provider: Hart Robinsons MD  Encounter Date: 05/14/2017      PT End of Session - 05/14/17 1517    Visit Number 28   Number of Visits 36   Date for PT Re-Evaluation 05/25/17   PT Start Time 8676   PT Stop Time 1605   PT Time Calculation (min) 51 min   Activity Tolerance Patient tolerated treatment well   Behavior During Therapy [facility] for tasks assessed/performed      Past Medical History:  Diagnosis Date  . Hyperlipemia   . Neck pain   . Rectal abscess   . Seasonal allergies     Past Surgical History:  Procedure Laterality Date  . BACK SURGERY    . NASAL SEPTUM SURGERY    . NECK SURGERY    . RECTAL SURGERY  09/19/11   rectal abscess  . ROTATOR CUFF REPAIR Right 02/19/2017   Dr. Theda Sers, [place]    There were no vitals filed for this visit.      Subjective Assessment - 05/14/17 1516    Subjective Reports he can almost reach flexion as much as he can. Reports he still has some tenderness when he rubs on his shoulder.   Patient Stated Goals Use right arm normally again.   Currently in Pain? Yes   Pain Score 3    Pain Location Shoulder   Pain Orientation Right   Pain Descriptors / Indicators Sore   Pain Type Surgical pain   Pain Onset More than a month ago            [facility] PT Assessment - 05/14/17 0001      Assessment   Medical Diagnosis Right shoulder SAD;DCR and RCR.   Onset Date/Surgical Date 02/19/17   Next MD Visit 05/20/2017     Restrictions   Weight Bearing Restrictions No                     [facility] Adult PT Treatment/Exercise - 05/14/17 0001      Shoulder Exercises: Prone   Retraction Strengthening;Right;Weights;20 reps   Retraction Weight (lbs) 2   Extension  Strengthening;Right;20 reps;Weights   Extension Weight (lbs) 2   Other Prone Exercises scaption 2x10 no weight     Shoulder Exercises: Sidelying   External Rotation Strengthening;Right;20 reps   External Rotation Weight (lbs) 2     Shoulder Exercises: Standing   Protraction Strengthening;Right;20 reps;Theraband   Theraband Level (Shoulder Protraction) Level 1 (Yellow)   External Rotation Strengthening;Right;Theraband;20 reps   Theraband Level (Shoulder External Rotation) Level 1 (Yellow)   Internal Rotation Strengthening;Right;20 reps;Theraband   Theraband Level (Shoulder Internal Rotation) Level 1 (Yellow)   Flexion Strengthening;Both;20 reps;Weights   Shoulder Flexion Weight (lbs) 1   Flexion Limitations R UT compensation noted   Extension Strengthening;Right;20 reps;Theraband   Theraband Level (Shoulder Extension) Level 1 (Yellow)   Row Strengthening;Right;20 reps;Theraband   Theraband Level (Shoulder Row) Level 1 (Yellow)   Other Standing Exercises Wall slides x20 reps     Shoulder Exercises: Pulleys   Flexion 3 minutes     Shoulder Exercises: Stretch   Internal Rotation Stretch 3 reps  x30 sec     Modalities   Modalities Electrical Stimulation;Vasopneumatic     Theme park manager  R shoulder   Electrical Stimulation Action Pre-Mod   Electrical Stimulation Parameters 80-150 hz x15 min   Electrical Stimulation Goals Pain     Vasopneumatic   Number Minutes Vasopneumatic  15 minutes   Vasopnuematic Location  Shoulder   Vasopneumatic Pressure Low   Vasopneumatic Temperature  34     Manual Therapy   Manual Therapy Passive ROM   Passive ROM PROM of R shoulder into flexion/ER with holds at end range                     PT Long Term Goals - 05/14/17 1554      PT LONG TERM GOAL #1   Title Independent with a HEP.   Time 4   Period Weeks   Status Achieved     PT LONG TERM GOAL #2   Title Active right shoulder  flexion to 145 degrees so the patient can easily reach overhead   Baseline 143 as of 05/08/17   Time 4   Period Weeks   Status Achieved     PT LONG TERM GOAL #3   Title Active ER to 70 degrees+ to allow for easily donning/doffing of apparel   Baseline 54 deg as of 05/08/17   Time 4   Period Weeks   Status On-going  AROM 60 degrees 05/11/17     PT LONG TERM GOAL #4   Title Increase ROM so patient is able to reach behind back   Baseline able to reach to L4   Time 4   Period Weeks   Status Achieved     PT LONG TERM GOAL #5   Title Perform ADL's with pain not > 3/10.   Time 4   Period Weeks   Status Achieved               Plan - 05/14/17 1554    Clinical Impression Statement Patient tolerated today's well as he arrived with minimal R shoulder soreness. Patient experienced only tolerable discomfort with theraband strengthening and intermittant stiffness per patient report. R UT compensation observed with shoulder flexion with VCs for patient to try to maintain neutral shoulder position with flexion. Firm end feels noted with PROM of R shoulder into flexion and ER with smooth arc of motion as well. Normal modalities response noted following removal of the modalities.   Rehab Potential Excellent   Clinical Impairments Affecting Rehab Potential surgury 02/19/17 / current 11 weeks 05/08/17   PT Frequency 3x / week   PT Duration 4 weeks   PT Treatment/Interventions ADLs/Self Care Home Management;Cryotherapy;Electrical Stimulation;Ultrasound;Moist Heat;Therapeutic activities;Therapeutic exercise;Neuromuscular re-education;Patient/family education;Passive range of motion;Manual techniques;Vasopneumatic Device   PT Next Visit Plan continue light PREs and shoulder girdle strengthening with continued ROM stretching per MPT POC and [facility] protocol.   (MD. Theda Sers 05/20/17)   PT Home Exercise Plan ER in supine with cane, putty hand/wrist exercises   Consulted and Agree with Plan of Care Patient       Patient will benefit from skilled therapeutic intervention in order to improve the following deficits and impairments:  Pain, Decreased activity tolerance, Decreased range of motion  Visit Diagnosis: Acute pain of right shoulder  Stiffness of right shoulder, not elsewhere classified  Muscle weakness (generalized)     Problem List Patient Active Problem List   Diagnosis Date Noted  . Constipation 08/07/2016  . Hyperlipidemia 03/25/2011  . Seasonal allergies     Wynelle Fanny, PTA 05/14/2017, 4:26 PM  [facility] [place], [place], [address] Phone: [phone]   Fax:  [phone]  Name: ALEXSIS BRANSCOM MRN: [id number] Date of Birth: [birth year]

## 2017-05-18 ENCOUNTER — Ambulatory Visit: Payer: Managed Care, Other (non HMO) | Admitting: Physical Therapy

## 2017-05-18 DIAGNOSIS — M25611 Stiffness of right shoulder, not elsewhere classified: Secondary | ICD-10-CM | POA: Diagnosis not present

## 2017-05-18 DIAGNOSIS — M6281 Muscle weakness (generalized): Secondary | ICD-10-CM

## 2017-05-18 DIAGNOSIS — M25511 Pain in right shoulder: Secondary | ICD-10-CM

## 2017-05-18 NOTE — Therapy (Signed)
[facility] [place], [place], [address] Phone: [phone]   Fax:  [phone]  Physical Therapy Treatment  Patient Details  Name: Earl Martinez MRN: [id number] Date of Birth: 11-04-1961 Referring Provider: Hart Robinsons MD  Encounter Date: 05/18/2017      PT End of Session - 05/18/17 0946    Visit Number 29   Number of Visits 36   Date for PT Re-Evaluation 05/25/17   PT Start Time 0945   PT Stop Time 1028   PT Time Calculation (min) 43 min   Activity Tolerance Patient tolerated treatment well   Behavior During Therapy [facility] for tasks assessed/performed      Past Medical History:  Diagnosis Date  . Hyperlipemia   . Neck pain   . Rectal abscess   . Seasonal allergies     Past Surgical History:  Procedure Laterality Date  . BACK SURGERY    . NASAL SEPTUM SURGERY    . NECK SURGERY    . RECTAL SURGERY  09/19/11   rectal abscess  . ROTATOR CUFF REPAIR Right 02/19/2017   Dr. Theda Sers, [place]    There were no vitals filed for this visit.      Subjective Assessment - 05/18/17 0947    Subjective Patient states his L shoulder is bothering him more than the right.   Patient Stated Goals Use right arm normally again.   Currently in Pain? Yes   Pain Score 1    Pain Location Shoulder   Pain Orientation Right   Pain Descriptors / Indicators Sore   Pain Type Surgical pain   Pain Onset More than a month ago   Pain Frequency Intermittent   Aggravating Factors  stretching   Pain Relieving Factors rest   Effect of Pain on Daily Activities unable to work            [facility] PT Assessment - 05/18/17 0001      AROM   AROM Assessment Site Shoulder   Right/Left Shoulder Right   Right Shoulder Flexion 153 Degrees   Right Shoulder Internal Rotation --  functional ROM to L4   Right Shoulder External Rotation 79 Degrees  standing with arm at 90 deg ABD                     [facility] Adult PT  Treatment/Exercise - 05/18/17 0001      Shoulder Exercises: Supine   Other Supine Exercises thoracic extension over bolster at two levels x 2 minutes   Other Supine Exercises hooklying snow angels x 5     Shoulder Exercises: Prone   Extension Strengthening;Right;20 reps   Extension Weight (lbs) 3   Horizontal ABduction 1 Strengthening;20 reps;Weights   Horizontal ABduction 1 Weight (lbs) 3   Horizontal ABduction 2 Strengthening;Right;20 reps;Weights  Y   Horizontal ABduction 2 Weight (lbs) 2     Shoulder Exercises: Standing   Row Strengthening;20 reps;Weights   Row Weight (lbs) 10 with 5#, 10 with 7#   Other Standing Exercises wall angels x 5 difficult;     Shoulder Exercises: Stretch   Other Shoulder Stretches supine pec stretch with arm off table. Also with arm in ER.     Manual Therapy   Manual Therapy Joint mobilization   Joint Mobilization thoracic PA mobs (T4-T8) gd III/IV                PT Education - 05/18/17 1034    Education provided Yes  Education Details thoracic extension; also discussed progression of strengthening when patient returns to gym workoouts.   Person(s) Educated Patient   Methods Explanation;Demonstration;Verbal cues   Comprehension Verbalized understanding;Returned demonstration             PT Long Term Goals - 05/18/17 0948      PT LONG TERM GOAL #1   Title Independent with a HEP.   Time 4   Period Weeks   Status Achieved     PT LONG TERM GOAL #2   Title Active right shoulder flexion to 145 degrees so the patient can easily reach overhead.   Baseline 153 as of 05/18/17   Time 4   Period Weeks   Status Achieved     PT LONG TERM GOAL #3   Title Active ER to 70 degrees+ to allow for easily donning/doffing of apparel   Baseline 79 degrees in standing as of 05/18/17.   Time 4   Period Weeks   Status Achieved     PT LONG TERM GOAL #4   Title Increase ROM so patient is able to reach behind back   Baseline able to reach to  L4   Time 4   Period Weeks   Status Achieved     PT LONG TERM GOAL #5   Title Perform ADL's with pain not > 3/10.   Time 4   Period Weeks   Status Achieved               Plan - 05/18/17 1036    Clinical Impression Statement Patient presents today with only minimal c/o of pain in Right shoulder. He has 5/5 MMT in R shoulder except ext which is 4+/5. Mid and low traps on R are 5-/5. He has met all LTGs for ROM and was able to perform resisted work simulation today witthout pain. He does have decreased thoracic spine PA mobility between T4 and 8 and exercise was issued to address this. Patient is ready for d/o and will see MD 6/13.   Rehab Potential Excellent   Clinical Impairments Affecting Rehab Potential surgery 02/19/17 / current 12 weeks 05/18/17   PT Frequency 3x / week   PT Duration 4 weeks   PT Treatment/Interventions ADLs/Self Care Home Management;Cryotherapy;Electrical Stimulation;Ultrasound;Moist Heat;Therapeutic activities;Therapeutic exercise;Neuromuscular re-education;Patient/family education;Passive range of motion;Manual techniques;Vasopneumatic Device   PT Next Visit Plan Send printed note from 05/18/17 with patient for MD visit 05/20/17. Continue strengthening and ROM prn.   PT Home Exercise Plan thoracic extension; ER in supine with cane, putty hand/wrist exercises   Consulted and Agree with Plan of Care Patient      Patient will benefit from skilled therapeutic intervention in order to improve the following deficits and impairments:  Pain, Decreased activity tolerance, Decreased range of motion  Visit Diagnosis: Muscle weakness (generalized)  Stiffness of right shoulder, not elsewhere classified  Acute pain of right shoulder     Problem List Patient Active Problem List   Diagnosis Date Noted  . Constipation 08/07/2016  . Hyperlipidemia 03/25/2011  . Seasonal allergies     Madelyn Flavors PT 05/18/2017, 3:28 PM  [facility] [address] [place], [place], [address] Phone: [phone]   Fax:  [phone]  Name: Earl Martinez MRN: [id number] Date of Birth: [birth year]

## 2017-05-19 ENCOUNTER — Telehealth: Payer: Self-pay | Admitting: Pediatrics

## 2017-05-19 NOTE — Telephone Encounter (Signed)
Spoke with pt and advised Dr Evette Doffing is out of the office today but will return tomorrow and offered appt with another provider. Pt declined stating he sees Dr Theda Sers tomorrow and will discuss with him.

## 2017-05-20 ENCOUNTER — Ambulatory Visit: Payer: Managed Care, Other (non HMO) | Admitting: Physical Therapy

## 2017-05-20 ENCOUNTER — Encounter: Payer: Self-pay | Admitting: Physical Therapy

## 2017-05-20 DIAGNOSIS — M25511 Pain in right shoulder: Secondary | ICD-10-CM

## 2017-05-20 DIAGNOSIS — M25611 Stiffness of right shoulder, not elsewhere classified: Secondary | ICD-10-CM

## 2017-05-20 DIAGNOSIS — M6281 Muscle weakness (generalized): Secondary | ICD-10-CM

## 2017-05-20 NOTE — Therapy (Signed)
[facility] [place], [place], [address] Phone: [phone]   Fax:  [phone]  Physical Therapy Treatment  Patient Details  Name: Earl Martinez MRN: [id number] Date of Birth: [birth year] Referring Provider: Hart Robinsons MD  Encounter Date: 05/20/2017      PT End of Session - 05/20/17 0729    Visit Number 30   Number of Visits 36   Date for PT Re-Evaluation 05/25/17   PT Start Time 0731   PT Stop Time 0827   PT Time Calculation (min) 56 min   Activity Tolerance Patient tolerated treatment well   Behavior During Therapy [facility] for tasks assessed/performed      Past Medical History:  Diagnosis Date  . Hyperlipemia   . Neck pain   . Rectal abscess   . Seasonal allergies     Past Surgical History:  Procedure Laterality Date  . BACK SURGERY    . NASAL SEPTUM SURGERY    . NECK SURGERY    . RECTAL SURGERY  09/19/11   rectal abscess  . ROTATOR CUFF REPAIR Right 02/19/2017   Dr. Theda Sers, [place]    There were no vitals filed for this visit.      Subjective Assessment - 05/20/17 0729    Subjective Reports a little more pain than last treatment. Reports that abduction felt weird with 2# handweights.   Patient Stated Goals Use right arm normally again.   Currently in Pain? Yes   Pain Score 3    Pain Location Shoulder   Pain Orientation Right   Pain Descriptors / Indicators Discomfort   Pain Type Surgical pain   Pain Onset More than a month ago            [facility] PT Assessment - 05/20/17 0001      Assessment   Medical Diagnosis Right shoulder SAD;DCR and RCR.   Onset Date/Surgical Date 02/19/17   Next MD Visit 05/20/2017     Restrictions   Weight Bearing Restrictions No     ROM / Strength   AROM / PROM / Strength AROM;Strength     AROM   AROM Assessment Site Shoulder   Right/Left Shoulder Right   Right Shoulder Flexion 163 Degrees   Right Shoulder Internal Rotation 58 Degrees  L4 prior  to towel stretch, L2-L3 after towel stretch   Right Shoulder External Rotation 73 Degrees     Strength   Overall Strength Deficits   Strength Assessment Site Shoulder   Right/Left Shoulder Right   Right Shoulder Flexion 5/5   Right Shoulder ABduction 4+/5   Right Shoulder Internal Rotation 5/5   Right Shoulder External Rotation 4+/5                     [facility] Adult PT Treatment/Exercise - 05/20/17 0001      Shoulder Exercises: Prone   Retraction Strengthening;Right;Weights;20 reps   Retraction Weight (lbs) 5   Extension Strengthening;Right;20 reps   Extension Weight (lbs) 5   Horizontal ABduction 1 Strengthening;20 reps;Weights   Horizontal ABduction 1 Weight (lbs) 3     Shoulder Exercises: Sidelying   External Rotation Strengthening;Right;20 reps   External Rotation Weight (lbs) 5     Shoulder Exercises: Standing   Flexion Strengthening;Both;20 reps;Weights   Shoulder Flexion Weight (lbs) 3   Flexion Limitations with mirror for visual cueing for R UT compensation   Other Standing Exercises RUE D1/D2/ OH lift 2# x20 reps each     Shoulder  Exercises: Pulleys   Flexion 3 minutes     Shoulder Exercises: ROM/Strengthening   Wall Pushups 20 reps     Shoulder Exercises: Stretch   Internal Rotation Stretch 3 reps  30 sec   Other Shoulder Stretches  Supine Pec stretch off table 3x30 sec     Modalities   Modalities Electrical Stimulation;Vasopneumatic     Electrical Stimulation   Electrical Stimulation Location R shoulder   Electrical Stimulation Action IFC   Electrical Stimulation Parameters 1-10 hz x15 min   Electrical Stimulation Goals Pain     Vasopneumatic   Number Minutes Vasopneumatic  15 minutes   Vasopnuematic Location  Shoulder   Vasopneumatic Pressure Low   Vasopneumatic Temperature  34                     PT Long Term Goals - 05/18/17 0948      PT LONG TERM GOAL #1   Title Independent with a HEP.   Time 4   Period Weeks    Status Achieved     PT LONG TERM GOAL #2   Title Active right shoulder flexion to 145 degrees so the patient can easily reach overhead.   Baseline 153 as of 05/18/17   Time 4   Period Weeks   Status Achieved     PT LONG TERM GOAL #3   Title Active ER to 70 degrees+ to allow for easily donning/doffing of apparel   Baseline 79 degrees in standing as of 05/18/17.   Time 4   Period Weeks   Status Achieved     PT LONG TERM GOAL #4   Title Increase ROM so patient is able to reach behind back   Baseline able to reach to L4   Time 4   Period Weeks   Status Achieved     PT LONG TERM GOAL #5   Title Perform ADL's with pain not > 3/10.   Time 4   Period Weeks   Status Achieved               Plan - 05/20/17 0820    Clinical Impression Statement Patient presented in clinic with worse symptoms of L shoulder pain than R shoulder. Patient had no complaints during treatment with strengthening exercises. R shouler elevation compensation noted today in standing with 3# handweight and mirror use utilized to allow patient to self correct via visual cues. ROM improved with rotation exercises following stretching and MMT measured as 4+/5-5/5. Normal modalities response noted following removal of the modalities.   Rehab Potential Excellent   Clinical Impairments Affecting Rehab Potential surgery 02/19/17 / current 12 weeks 05/18/17   PT Frequency 3x / week   PT Duration 4 weeks   PT Treatment/Interventions ADLs/Self Care Home Management;Cryotherapy;Electrical Stimulation;Ultrasound;Moist Heat;Therapeutic activities;Therapeutic exercise;Neuromuscular re-education;Patient/family education;Passive range of motion;Manual techniques;Vasopneumatic Device   PT Next Visit Plan Send printed note from 05/18/17 with patient for MD visit 05/20/17. Continue strengthening and ROM prn.   PT Home Exercise Plan thoracic extension; ER in supine with cane, putty hand/wrist exercises   Consulted and Agree with Plan of  Care Patient      Patient will benefit from skilled therapeutic intervention in order to improve the following deficits and impairments:  Pain, Decreased activity tolerance, Decreased range of motion  Visit Diagnosis: Stiffness of right shoulder, not elsewhere classified  Muscle weakness (generalized)  Acute pain of right shoulder     Problem List Patient Active Problem List   Diagnosis  Date Noted  . Constipation 08/07/2016  . Hyperlipidemia 03/25/2011  . Seasonal allergies     Wynelle Fanny, PTA 05/20/2017, 8:30 AM  [facility] [address] [place], [place], [address] Phone: [phone]   Fax:  [phone]  Name: Earl Martinez MRN: [id number] Date of Birth: 10/28/1961

## 2017-05-28 ENCOUNTER — Encounter: Payer: Self-pay | Admitting: Physical Therapy

## 2017-05-28 ENCOUNTER — Ambulatory Visit: Payer: Managed Care, Other (non HMO) | Admitting: Physical Therapy

## 2017-05-28 DIAGNOSIS — M6281 Muscle weakness (generalized): Secondary | ICD-10-CM

## 2017-05-28 DIAGNOSIS — M25611 Stiffness of right shoulder, not elsewhere classified: Secondary | ICD-10-CM

## 2017-05-28 DIAGNOSIS — M25511 Pain in right shoulder: Secondary | ICD-10-CM

## 2017-05-28 NOTE — Patient Instructions (Addendum)
Scapular Retraction: Bilateral    Facing anchor, pull arms back, bringing shoulder blades together. Repeat 20____ times per set. Do _2___ sets per session. Do __2__ sessions per day.   Strengthening: Chest Pull - Resisted    With resistive band looped around each hand, and arms straight out in front, stretch band across chest. Half range Repeat __10-20__ times per set. Do _2___ sets per session. Do __2__ sessions per day.   Progressive Resisted: External Rotation (Side-Lying)    Holding _3-5___ pound weight, towel under arm, raise right forearm toward ceiling. Keep elbow bent and at side. Repeat __1030__ times per set. Do _1-2___ sets per session. Do __2__ sessions per day.

## 2017-05-28 NOTE — Therapy (Addendum)
[facility] [place], [place], [address] Phone: [phone]   Fax:  [phone]  Physical Therapy Treatment  Patient Details  Name: Earl Martinez MRN: [id number] Date of Birth: 09-24-1961 Referring Provider: Hart Robinsons MD  Encounter Date: 05/28/2017      PT End of Session - 05/28/17 0903    Visit Number 31   Number of Visits 36   Date for PT Re-Evaluation 05/25/17   PT Start Time 0818   PT Stop Time 0901   PT Time Calculation (min) 43 min   Activity Tolerance Patient tolerated treatment well   Behavior During Therapy [facility] for tasks assessed/performed      Past Medical History:  Diagnosis Date  . Hyperlipemia   . Neck pain   . Rectal abscess   . Seasonal allergies     Past Surgical History:  Procedure Laterality Date  . BACK SURGERY    . NASAL SEPTUM SURGERY    . NECK SURGERY    . RECTAL SURGERY  09/19/11   rectal abscess  . ROTATOR CUFF REPAIR Right 02/19/2017   Dr. Theda Sers, [place]    There were no vitals filed for this visit.      Subjective Assessment - 05/28/17 0819    Subjective Patient reported minimal soreness overall and has been back to work with little discomfort   Patient Stated Goals Use right arm normally again.   Currently in Pain? Yes   Pain Score 1    Pain Location Shoulder   Pain Orientation Right   Pain Descriptors / Indicators Discomfort   Pain Type Surgical pain   Pain Onset More than a month ago   Pain Frequency Intermittent   Aggravating Factors  stretching   Pain Relieving Factors at rest                         [facility] Adult PT Treatment/Exercise - 05/28/17 0001      Shoulder Exercises: Supine   Protraction Strengthening;Right;20 reps  serratus punch     Shoulder Exercises: Prone   Retraction Strengthening;Right;Weights  x30   Retraction Weight (lbs) 5   Extension Strengthening;Right  x30   Extension Weight (lbs) 5   Horizontal  ABduction 1 Strengthening;20 reps;Weights   Horizontal ABduction 1 Weight (lbs) 3     Shoulder Exercises: Sidelying   External Rotation Strengthening;Right;20 reps   External Rotation Weight (lbs) 5     Shoulder Exercises: Standing   Protraction Strengthening;Right;20 reps;Theraband   Theraband Level (Shoulder Protraction) Level 2 (Red)   External Rotation Strengthening;Right;Theraband;20 reps   Theraband Level (Shoulder External Rotation) Level 2 (Red)   Internal Rotation Strengthening;Right;20 reps;Theraband   Theraband Level (Shoulder Internal Rotation) Level 2 (Red)   Extension Strengthening;Right;20 reps;Theraband   Theraband Level (Shoulder Extension) Level 2 (Red)   Row Strengthening;20 reps;Weights   Theraband Level (Shoulder Row) Level 2 (Red)   Retraction Strengthening;Both;Weights;20 reps   Retraction Weight (lbs) 40#   Other Standing Exercises tricp x30#, bicep #20 x30 each     Shoulder Exercises: Pulleys   Other Pulley Exercises UE ranger into flexion, CCW and ER      Shoulder Exercises: ROM/Strengthening   UBE (Upper Arm Bike) 66mn 60RPM                PT Education - 05/28/17 0858    Education provided Yes   Education Details HEP with red and green t-band   Person(s) Educated Patient  Methods Explanation;Demonstration;Handout   Comprehension Verbalized understanding;Returned demonstration             PT Long Term Goals - 05/18/17 0948      PT LONG TERM GOAL #1   Title Independent with a HEP.   Time 4   Period Weeks   Status Achieved     PT LONG TERM GOAL #2   Title Active right shoulder flexion to 145 degrees so the patient can easily reach overhead.   Baseline 153 as of 05/18/17   Time 4   Period Weeks   Status Achieved     PT LONG TERM GOAL #3   Title Active ER to 70 degrees+ to allow for easily donning/doffing of apparel   Baseline 79 degrees in standing as of 05/18/17.   Time 4   Period Weeks   Status Achieved     PT LONG TERM  GOAL #4   Title Increase ROM so patient is able to reach behind back   Baseline able to reach to L4   Time 4   Period Weeks   Status Achieved     PT LONG TERM GOAL #5   Title Perform ADL's with pain not > 3/10.   Time 4   Period Weeks   Status Achieved               Plan - 05/28/17 0904    Clinical Impression Statement Patient tolerated treatment well. Patient reviewed HEP and new one given. Patient progresssed with all exercises with good technique. Patient has met all goals and will be on hold unitil he speaks with MD.    Rehab Potential Excellent   Clinical Impairments Affecting Rehab Potential surgery 02/19/17 / current 12 weeks 05/18/17   PT Frequency 3x / week   PT Duration 4 weeks   PT Treatment/Interventions ADLs/Self Care Home Management;Cryotherapy;Electrical Stimulation;Ultrasound;Moist Heat;Therapeutic activities;Therapeutic exercise;Neuromuscular re-education;Patient/family education;Passive range of motion;Manual techniques;Vasopneumatic Device   PT Next Visit Plan on hold      Patient will benefit from skilled therapeutic intervention in order to improve the following deficits and impairments:  Pain, Decreased activity tolerance, Decreased range of motion  Visit Diagnosis: Stiffness of right shoulder, not elsewhere classified  Muscle weakness (generalized)  Acute pain of right shoulder     Problem List Patient Active Problem List   Diagnosis Date Noted  . Constipation 08/07/2016  . Hyperlipidemia 03/25/2011  . Seasonal allergies     Earl Martinez, PTA 05/28/2017, 9:08 AM  [facility] [address] [place], [place], [address] Phone: [phone]   Fax:  [phone]  Name: Earl Martinez MRN: [id number] Date of Birth: 22-May-1961  PHYSICAL THERAPY DISCHARGE SUMMARY  Visits from Start of Care: 31.  Current functional level related to goals / functional outcomes: See above.   Remaining  deficits: All goals achieved.    Education / Equipment: HEP. Plan: Patient agrees to discharge.  Patient goals were met. Patient is being discharged due to meeting the stated rehab goals.  ?????         Mali Applegate MPT

## 2017-06-05 ENCOUNTER — Other Ambulatory Visit: Payer: Self-pay | Admitting: Pediatrics

## 2017-06-05 DIAGNOSIS — R350 Frequency of micturition: Principal | ICD-10-CM

## 2017-06-05 DIAGNOSIS — N401 Enlarged prostate with lower urinary tract symptoms: Secondary | ICD-10-CM

## 2017-08-27 ENCOUNTER — Other Ambulatory Visit: Payer: Self-pay | Admitting: Pediatrics

## 2017-08-27 DIAGNOSIS — J309 Allergic rhinitis, unspecified: Secondary | ICD-10-CM

## 2017-09-07 DIAGNOSIS — R1314 Dysphagia, pharyngoesophageal phase: Secondary | ICD-10-CM | POA: Insufficient documentation

## 2017-09-07 DIAGNOSIS — R0989 Other specified symptoms and signs involving the circulatory and respiratory systems: Secondary | ICD-10-CM | POA: Insufficient documentation

## 2018-04-12 ENCOUNTER — Telehealth: Payer: Self-pay | Admitting: *Deleted

## 2018-04-12 DIAGNOSIS — E785 Hyperlipidemia, unspecified: Secondary | ICD-10-CM

## 2018-04-12 MED ORDER — ATORVASTATIN CALCIUM 40 MG PO TABS
40.0000 mg | ORAL_TABLET | Freq: Every day | ORAL | 0 refills | Status: DC
Start: 2018-04-12 — End: 2018-05-08

## 2018-04-12 NOTE — Telephone Encounter (Signed)
VM received Pt has appt with Dr. Evette Doffing on 05/05/18 will run out of atorvastation needs refill to [facility] Refill sent to pharmacy

## 2018-04-12 NOTE — Telephone Encounter (Signed)
LMOVM refill has been sent to pharmacy 

## 2018-04-14 ENCOUNTER — Other Ambulatory Visit: Payer: Self-pay | Admitting: Pediatrics

## 2018-04-14 DIAGNOSIS — J309 Allergic rhinitis, unspecified: Secondary | ICD-10-CM

## 2018-04-14 NOTE — Telephone Encounter (Signed)
OV 05/05/18

## 2018-05-05 ENCOUNTER — Ambulatory Visit: Payer: Self-pay | Admitting: Pediatrics

## 2018-05-08 ENCOUNTER — Other Ambulatory Visit: Payer: Self-pay | Admitting: Pediatrics

## 2018-05-08 DIAGNOSIS — J309 Allergic rhinitis, unspecified: Secondary | ICD-10-CM

## 2018-05-08 DIAGNOSIS — E785 Hyperlipidemia, unspecified: Secondary | ICD-10-CM

## 2018-05-10 NOTE — Telephone Encounter (Signed)
Ov changed fr 5/29 - 05/17/18

## 2018-05-17 ENCOUNTER — Ambulatory Visit: Payer: 59 | Admitting: Pediatrics

## 2018-05-17 ENCOUNTER — Encounter: Payer: Self-pay | Admitting: Pediatrics

## 2018-05-17 VITALS — BP 130/87 | HR 68 | Temp 97.0°F | Ht 71.5 in | Wt 202.2 lb

## 2018-05-17 DIAGNOSIS — R350 Frequency of micturition: Secondary | ICD-10-CM

## 2018-05-17 DIAGNOSIS — Z0000 Encounter for general adult medical examination without abnormal findings: Secondary | ICD-10-CM | POA: Diagnosis not present

## 2018-05-17 DIAGNOSIS — N401 Enlarged prostate with lower urinary tract symptoms: Secondary | ICD-10-CM

## 2018-05-17 DIAGNOSIS — E785 Hyperlipidemia, unspecified: Secondary | ICD-10-CM

## 2018-05-17 MED ORDER — ATORVASTATIN CALCIUM 40 MG PO TABS: 40.0000 mg | ORAL_TABLET | Freq: Every day | ORAL | 3 refills | Status: DC

## 2018-05-17 MED ORDER — TAMSULOSIN HCL 0.4 MG PO CAPS: 0.4000 mg | ORAL_CAPSULE | Freq: Every day | ORAL | 3 refills | Status: DC

## 2018-05-17 NOTE — Progress Notes (Signed)
  Subjective:   Patient ID: Earl Martinez, male    DOB: Apr 24, 1961, 57 y.o.   MRN: [id number] CC: Annual exam  HPI: Earl Martinez is a 57 y.o. male   BPH: Symptoms stable, taking tamsulosin regularly  Hyperlipidemia: Taking atorvastatin regularly.  No side effects.  Trying to avoid sugary foods and beverages.  Trying to get some walking in.  Has had surgery on her shoulder within the last year.  Relevant past medical, surgical, family and social history reviewed. Allergies and medications reviewed and updated. Social History   Tobacco Use  Smoking Status Never Smoker  Smokeless Tobacco Never Used   ROS: All symptoms negative other than what is in the HPI  Objective:    BP 130/87   Pulse 68   Temp (!) 97 F (36.1 C) (Oral)   Ht 5' 11.5" (1.816 m)   Wt 202 lb 3.2 oz (91.7 kg)   BMI 27.81 kg/m   Wt Readings from Last 3 Encounters:  05/17/18 202 lb 3.2 oz (91.7 kg)  04/01/17 204 lb 12.8 oz (92.9 kg)  09/02/16 205 lb (93 kg)    Gen: NAD, alert, cooperative with exam, NCAT EYES: EOMI, no conjunctival injection, or no icterus ENT:  TMs pearly gray b/l, OP without erythema LYMPH: no cervical LAD CV: NRRR, normal S1/S2, no murmur, distal pulses 2+ b/l Resp: CTABL, no wheezes, normal WOB Abd: +BS, soft, NTND. no guarding or organomegaly Ext: No edema, warm Neuro: Alert and oriented, strength equal b/l UE and LE, coordination grossly normal MSK: normal muscle bulk  Assessment & Plan:  Eder was seen today for medical management of chronic issues.  Diagnoses and all orders for this visit:  Encounter for preventive health examination  Hyperlipidemia, unspecified hyperlipidemia type  Stable, continue below-     Lipid panel -     BMP8+EGFR -     atorvastatin (LIPITOR) 40 MG tablet; Take 1 tablet (40 mg total) by mouth daily.  Benign prostatic hyperplasia with urinary frequency Table, continue below -     tamsulosin (FLOMAX) 0.4 MG CAPS capsule; Take 1 capsule (0.4 mg  total) by mouth daily.   Follow up plan: Return in about 1 year (around 05/18/2019). [name], MD [place]

## 2018-05-18 LAB — LIPID PANEL
CHOL/HDL RATIO: 2.9 ratio (ref 0.0–5.0)
Cholesterol, Total: 132 mg/dL (ref 100–199)
HDL: 46 mg/dL (ref >39)
LDL Calculated: 72 mg/dL (ref 0–99)
Triglycerides: 71 mg/dL (ref 0–149)
VLDL CHOLESTEROL CAL: 14 mg/dL (ref 5–40)

## 2018-05-18 LAB — BMP8+EGFR
BUN / CREAT RATIO: 15 (ref 9–20)
BUN: 15 mg/dL (ref 6–24)
CO2: 25 mmol/L (ref 20–29)
Calcium: 9.8 mg/dL (ref 8.7–10.2)
Chloride: 104 mmol/L (ref 96–106)
Creatinine, Ser: 0.97 mg/dL (ref 0.76–1.27)
GFR calc Af Amer: 102 mL/min/{1.73_m2} (ref >59)
GFR calc non Af Amer: 88 mL/min/{1.73_m2} (ref >59)
GLUCOSE: 103 mg/dL — AB (ref 65–99)
POTASSIUM: 5 mmol/L (ref 3.5–5.2)
SODIUM: 141 mmol/L (ref 134–144)

## 2018-06-15 ENCOUNTER — Other Ambulatory Visit: Payer: Self-pay | Admitting: Pediatrics

## 2018-06-15 DIAGNOSIS — N401 Enlarged prostate with lower urinary tract symptoms: Secondary | ICD-10-CM

## 2018-06-15 DIAGNOSIS — R350 Frequency of micturition: Principal | ICD-10-CM

## 2018-08-21 ENCOUNTER — Other Ambulatory Visit: Payer: Self-pay | Admitting: Pediatrics

## 2018-08-21 DIAGNOSIS — J309 Allergic rhinitis, unspecified: Secondary | ICD-10-CM

## 2018-12-27 ENCOUNTER — Encounter: Payer: Self-pay | Admitting: Family Medicine

## 2018-12-27 ENCOUNTER — Ambulatory Visit (INDEPENDENT_AMBULATORY_CARE_PROVIDER_SITE_OTHER): Payer: 59 | Admitting: Family Medicine

## 2018-12-27 VITALS — BP 127/75 | HR 68 | Temp 98.3°F | Ht 71.5 in | Wt 204.0 lb

## 2018-12-27 DIAGNOSIS — R0981 Nasal congestion: Secondary | ICD-10-CM

## 2018-12-27 DIAGNOSIS — G44209 Tension-type headache, unspecified, not intractable: Secondary | ICD-10-CM | POA: Diagnosis not present

## 2018-12-27 DIAGNOSIS — H6503 Acute serous otitis media, bilateral: Secondary | ICD-10-CM | POA: Diagnosis not present

## 2018-12-27 MED ORDER — PSEUDOEPHEDRINE-GUAIFENESIN ER 120-1200 MG PO TB12: 1.0000 | ORAL_TABLET | Freq: Two times a day (BID) | ORAL | 0 refills | Status: DC

## 2018-12-27 MED ORDER — AMOXICILLIN-POT CLAVULANATE 875-125 MG PO TABS: 1.0000 | ORAL_TABLET | Freq: Two times a day (BID) | ORAL | 0 refills | Status: DC

## 2018-12-27 MED ORDER — BETAMETHASONE SOD PHOS & ACET 6 (3-3) MG/ML IJ SUSP
6.0000 mg | Freq: Once | INTRAMUSCULAR | Status: AC
Start: 2018-12-27 — End: 2018-12-27
  Administered 2018-12-27: 6 mg via INTRAMUSCULAR

## 2018-12-27 NOTE — Progress Notes (Signed)
Subjective:  Patient ID: Earl Martinez, male    DOB: 05-Jun-1961  Age: 58 y.o. MRN: [id number]  CC: Headache (pt here today c/o headaches ) and Ear Pain (also c/o ear pain)   HPI Earl Martinez presents for pain in the ears left greater than the right onset about a week ago.  It has not affected his sense of hearing.  Over the last couple of days he has noticed increasing short constant congestion.  He is also had a little bit of a twitch at the corner of his right eye.  He has had itching in the throat as well.  There is been some cough also.  The headache is mild it is been present off and on for about a month.  He describes it as an occasional sharp pain at the right postauricular scalp.  He describes it as a bit of a cough feeling. Depression screen [facility] 2/9 12/27/2018 05/17/2018 04/01/2017  Decreased Interest 0 0 0  Down, Depressed, Hopeless 0 0 0  PHQ - 2 Score 0 0 0  Altered sleeping - - -  Tired, decreased energy - - -  Change in appetite - - -  Feeling bad or failure about yourself  - - -  Trouble concentrating - - -  Moving slowly or fidgety/restless - - -  Suicidal thoughts - - -  PHQ-9 Score - - -  Difficult doing work/chores - - -    History Earl Martinez has a past medical history of Hyperlipemia, Neck pain, Rectal abscess, and Seasonal allergies.   He has a past surgical history that includes Rectal surgery (09/19/11); Neck surgery; Nasal septum surgery; Back surgery; and Rotator cuff repair (Right, 02/19/2017).   His family history is not on file.He reports that he has never smoked. He has never used smokeless tobacco. He reports that he does not drink alcohol or use drugs.    ROS Review of Systems  Constitutional: Negative.  Negative for activity change, appetite change, chills and fever.  HENT: Positive for congestion, postnasal drip, rhinorrhea and sinus pressure. Negative for ear discharge, ear pain, hearing loss, nosebleeds, sneezing and trouble swallowing.   Eyes:  Negative for visual disturbance.  Respiratory: Negative for cough, chest tightness and shortness of breath.   Cardiovascular: Negative for chest pain, palpitations and leg swelling.  Gastrointestinal: Negative for abdominal pain, diarrhea, nausea and vomiting.  Genitourinary: Negative for difficulty urinating.  Musculoskeletal: Negative for arthralgias and myalgias.  Skin: Negative for rash.  Neurological: Positive for headaches.  Psychiatric/Behavioral: Negative for sleep disturbance.    Objective:  BP 127/75   Pulse 68   Temp 98.3 F (36.8 C) (Oral)   Ht 5' 11.5" (1.816 m)   Wt 204 lb (92.5 kg)   BMI 28.06 kg/m   BP Readings from Last 3 Encounters:  12/27/18 127/75  05/17/18 130/87  04/01/17 125/81    Wt Readings from Last 3 Encounters:  12/27/18 204 lb (92.5 kg)  05/17/18 202 lb 3.2 oz (91.7 kg)  04/01/17 204 lb 12.8 oz (92.9 kg)     Physical Exam Constitutional:      General: He is not in acute distress.    Appearance: He is well-developed.  HENT:     Head: Normocephalic and atraumatic.     Right Ear: External ear normal. A middle ear effusion is present.     Left Ear: External ear normal. A middle ear effusion is present.     Nose: Nose normal.  Eyes:  Conjunctiva/sclera: Conjunctivae normal.     Pupils: Pupils are equal, round, and reactive to light.  Neck:     Musculoskeletal: Normal range of motion and neck supple.  Cardiovascular:     Rate and Rhythm: Normal rate and regular rhythm.     Heart sounds: Normal heart sounds. No murmur.  Pulmonary:     Effort: Pulmonary effort is normal. No respiratory distress.     Breath sounds: Normal breath sounds. No wheezing or rales.  Abdominal:     Palpations: Abdomen is soft.     Tenderness: There is no abdominal tenderness.  Musculoskeletal: Normal range of motion.  Skin:    General: Skin is warm and dry.  Neurological:     Mental Status: He is alert and oriented to person, place, and time.     Deep  Tendon Reflexes: Reflexes are normal and symmetric.  Psychiatric:        Behavior: Behavior normal.        Thought Content: Thought content normal.        Judgment: Judgment normal.       Assessment & Plan:   Earl Martinez was seen today for headache and ear pain.  Diagnoses and all orders for this visit:  Tension headache  Sinus congestion -     betamethasone acetate-betamethasone sodium phosphate (CELESTONE) injection 6 mg  Bilateral acute serous otitis media, recurrence not specified  Other orders -     amoxicillin-clavulanate (AUGMENTIN) 875-125 MG tablet; Take 1 tablet by mouth 2 (two) times daily. Take all of this medication -     Pseudoephedrine-Guaifenesin [phone] MG TB12; Take 1 tablet by mouth 2 (two) times daily. For congestion       I am having Earl Martinez start on amoxicillin-clavulanate and Pseudoephedrine-Guaifenesin. I am also having him maintain his fish oil-omega-3 fatty acids, multivitamin, docusate sodium, cetirizine, atorvastatin, tamsulosin, and fluticasone. We administered betamethasone acetate-betamethasone sodium phosphate.  Allergies as of 12/27/2018   No Known Allergies     Medication List       Accurate as of December 27, 2018  3:56 PM. Always use your most recent med list.        amoxicillin-clavulanate 875-125 MG tablet Commonly known as:  AUGMENTIN Take 1 tablet by mouth 2 (two) times daily. Take all of this medication   atorvastatin 40 MG tablet Commonly known as:  LIPITOR Take 1 tablet (40 mg total) by mouth daily.   cetirizine 10 MG tablet Commonly known as:  ZYRTEC Take 1 tablet (10 mg total) by mouth daily.   docusate sodium 100 MG capsule Commonly known as:  COLACE Take 100 mg by mouth daily as needed for mild constipation.   fish oil-omega-3 fatty acids 1000 MG capsule Take 2 g by mouth daily.   fluticasone 50 MCG/ACT nasal spray Commonly known as:  FLONASE SPRAY 2 SPRAYS INTO EACH NOSTRIL EVERY DAY   multivitamin  tablet Take 1 tablet by mouth daily.   Pseudoephedrine-Guaifenesin [phone] MG Tb12 Take 1 tablet by mouth 2 (two) times daily. For congestion   tamsulosin 0.4 MG Caps capsule Commonly known as:  FLOMAX TAKE 1 CAPSULE DAILY        Follow-up: Return if symptoms worsen or fail to improve.  Claretta Fraise, M.D.

## 2019-05-04 ENCOUNTER — Other Ambulatory Visit: Payer: Self-pay | Admitting: Pediatrics

## 2019-05-04 DIAGNOSIS — J309 Allergic rhinitis, unspecified: Secondary | ICD-10-CM

## 2019-06-23 ENCOUNTER — Other Ambulatory Visit: Payer: Self-pay | Admitting: *Deleted

## 2019-06-23 DIAGNOSIS — N401 Enlarged prostate with lower urinary tract symptoms: Secondary | ICD-10-CM

## 2019-06-24 ENCOUNTER — Other Ambulatory Visit: Payer: Self-pay | Admitting: Family Medicine

## 2019-06-24 DIAGNOSIS — N401 Enlarged prostate with lower urinary tract symptoms: Secondary | ICD-10-CM

## 2019-06-24 MED ORDER — TAMSULOSIN HCL 0.4 MG PO CAPS: 0.4000 mg | ORAL_CAPSULE | Freq: Every day | ORAL | 0 refills | Status: DC

## 2019-06-24 NOTE — Telephone Encounter (Signed)
What is the name of the medication? Folmax   Have you contacted your pharmacy to request a refill? yes  Which pharmacy would you like this sent to? [facility]   Patient notified that their request is being sent to the clinical staff for review and that they should receive a call once it is complete. If they do not receive a call within 24 hours they can check with their pharmacy or our office.  * Pt is Short staff at work and can not get off right now to come in for an appointment.

## 2019-06-24 NOTE — Telephone Encounter (Signed)
Left detailed message on wife's voicemail that rx was sent to pharmacy

## 2019-06-29 ENCOUNTER — Telehealth: Payer: Self-pay | Admitting: Family Medicine

## 2019-06-29 NOTE — Telephone Encounter (Signed)
I would say he should try different kinds of masks and if he needs to be treated for his anxiety then, and we can discuss that because most people do not get anxious from a mask but if he does then we can always discuss helping with that.

## 2019-06-29 NOTE — Telephone Encounter (Signed)
Pt aware to try different mask = he would also like stacks opinion on the mask once he is back

## 2019-07-11 NOTE — Telephone Encounter (Signed)
Please contact the patient. Since he does not have anxiety as an ongoing diagnosis, I can not exempt him from use of a mask. However, I can see him for the anxiety symptoms WS

## 2019-07-19 NOTE — Telephone Encounter (Signed)
Multiple attempts made to contact patient.  This encounter will now be closed  

## 2019-07-26 ENCOUNTER — Other Ambulatory Visit: Payer: Self-pay | Admitting: Family Medicine

## 2019-07-26 DIAGNOSIS — J309 Allergic rhinitis, unspecified: Secondary | ICD-10-CM

## 2019-07-27 ENCOUNTER — Other Ambulatory Visit: Payer: Self-pay | Admitting: *Deleted

## 2019-07-27 DIAGNOSIS — E785 Hyperlipidemia, unspecified: Secondary | ICD-10-CM

## 2019-08-17 ENCOUNTER — Encounter: Payer: Self-pay | Admitting: Family Medicine

## 2019-08-17 ENCOUNTER — Ambulatory Visit (INDEPENDENT_AMBULATORY_CARE_PROVIDER_SITE_OTHER): Payer: 59 | Admitting: Family Medicine

## 2019-08-17 DIAGNOSIS — R350 Frequency of micturition: Secondary | ICD-10-CM | POA: Diagnosis not present

## 2019-08-17 DIAGNOSIS — E785 Hyperlipidemia, unspecified: Secondary | ICD-10-CM | POA: Diagnosis not present

## 2019-08-17 DIAGNOSIS — N401 Enlarged prostate with lower urinary tract symptoms: Secondary | ICD-10-CM

## 2019-08-17 MED ORDER — ATORVASTATIN CALCIUM 40 MG PO TABS: 40.0000 mg | ORAL_TABLET | Freq: Every day | ORAL | 1 refills | Status: DC

## 2019-08-17 MED ORDER — TAMSULOSIN HCL 0.4 MG PO CAPS: 0.4000 mg | ORAL_CAPSULE | Freq: Every day | ORAL | 1 refills | Status: DC

## 2019-08-17 NOTE — Progress Notes (Signed)
Subjective:    Patient ID: Earl Martinez, male    DOB: Sep 26, 1961, 58 y.o.   MRN: [id number]   HPI: Earl Martinez is a 58 y.o. male presenting for  in for follow-up of elevated cholesterol. Doing well without complaints on current medication. Occasional heaviness and calf myalgia resolves with taking 1/2 dose for a few days. side effects of statin including myalgia and arthralgia and nausea. Currently no chest pain, shortness of breath or other cardiovascular related symptoms noted.   Up to bathroom about once a night. Occasionally twice. Still feels rested in the AM. Using flonase regularly.   Depression screen [facility] 2/9 12/27/2018 05/17/2018 04/01/2017 09/02/2016 08/07/2016  Decreased Interest 0 0 0 1 1  Down, Depressed, Hopeless 0 0 0 1 2  PHQ - 2 Score 0 0 0 2 3  Altered sleeping - - - 1 1  Tired, decreased energy - - - 2 2  Change in appetite - - - 1 0  Feeling bad or failure about yourself  - - - 1 1  Trouble concentrating - - - 1 1  Moving slowly or fidgety/restless - - - 0 0  Suicidal thoughts - - - 0 0  PHQ-9 Score - - - 8 8  Difficult doing work/chores - - - Somewhat difficult Somewhat difficult     Relevant past medical, surgical, family and social history reviewed and updated as indicated.  Interim medical history since our last visit reviewed. Allergies and medications reviewed and updated.  ROS:  Review of Systems  Constitutional: Negative.   HENT: Negative.   Eyes: Negative for visual disturbance.  Respiratory: Negative for cough and shortness of breath.   Cardiovascular: Negative for chest pain and leg swelling.  Gastrointestinal: Negative for abdominal pain, diarrhea, nausea and vomiting.  Genitourinary: Negative for difficulty urinating.  Musculoskeletal: Negative for arthralgias and myalgias.  Skin: Negative for rash.  Neurological: Negative for headaches.  Psychiatric/Behavioral: Negative for sleep disturbance.     Social History   Tobacco Use   Smoking Status Never Smoker  Smokeless Tobacco Never Used       Objective:     Wt Readings from Last 3 Encounters:  12/27/18 204 lb (92.5 kg)  05/17/18 202 lb 3.2 oz (91.7 kg)  04/01/17 204 lb 12.8 oz (92.9 kg)     Exam deferred. Pt. Harboring due to COVID 19. Phone visit performed.   Assessment & Plan:   1. Hyperlipidemia, unspecified hyperlipidemia type   2. Benign prostatic hyperplasia with urinary frequency     Meds ordered this encounter  Medications  . atorvastatin (LIPITOR) 40 MG tablet    Sig: Take 1 tablet (40 mg total) by mouth daily.    Dispense:  90 tablet    Refill:  1  . tamsulosin (FLOMAX) 0.4 MG CAPS capsule    Sig: Take 1 capsule (0.4 mg total) by mouth daily.    Dispense:  90 capsule    Refill:  1    Must be seen before next refill    No orders of the defined types were placed in this encounter.     Diagnoses and all orders for this visit:  Hyperlipidemia, unspecified hyperlipidemia type -     atorvastatin (LIPITOR) 40 MG tablet; Take 1 tablet (40 mg total) by mouth daily.  Benign prostatic hyperplasia with urinary frequency -     tamsulosin (FLOMAX) 0.4 MG CAPS capsule; Take 1 capsule (0.4 mg total) by mouth daily.  Virtual Visit via telephone Note  I discussed the limitations, risks, security and privacy concerns of performing an evaluation and management service by telephone and the availability of in person appointments. The patient was identified with two identifiers. Pt.expressed understanding and agreed to proceed. Pt. Is at home. Dr. Livia Snellen is in his office.  Follow Up Instructions:   I discussed the assessment and treatment plan with the patient. The patient was provided an opportunity to ask questions and all were answered. The patient agreed with the plan and demonstrated an understanding of the instructions.   The patient was advised to call back or seek an in-person evaluation if the symptoms worsen or if the condition fails  to improve as anticipated.   Total minutes including chart review and phone contact time: 24    Follow up plan: Return in about 6 months (around 02/14/2020).  Claretta Fraise, MD [place]

## 2019-10-31 ENCOUNTER — Telehealth: Payer: Self-pay | Admitting: Family Medicine

## 2019-10-31 ENCOUNTER — Encounter: Payer: Self-pay | Admitting: Family Medicine

## 2019-10-31 NOTE — Telephone Encounter (Signed)
faxed

## 2019-10-31 NOTE — Telephone Encounter (Signed)
I wrote a letter. It should be on the printer by the vaccine refrigerator.  Thanks, WS

## 2020-01-16 ENCOUNTER — Encounter: Payer: Self-pay | Admitting: Family Medicine

## 2020-01-16 ENCOUNTER — Other Ambulatory Visit: Payer: Self-pay

## 2020-01-16 ENCOUNTER — Ambulatory Visit (HOSPITAL_COMMUNITY)
Admission: RE | Admit: 2020-01-16 | Discharge: 2020-01-16 | Disposition: A | Discharge status: Home / Self Care | Payer: 59 | Source: Ambulatory Visit | Attending: Family Medicine | Admitting: Family Medicine

## 2020-01-16 ENCOUNTER — Ambulatory Visit (INDEPENDENT_AMBULATORY_CARE_PROVIDER_SITE_OTHER): Payer: 59 | Admitting: Family Medicine

## 2020-01-16 VITALS — BP 133/82 | HR 64 | Temp 98.4°F | Ht 71.5 in | Wt 208.2 lb

## 2020-01-16 DIAGNOSIS — R309 Painful micturition, unspecified: Secondary | ICD-10-CM

## 2020-01-16 DIAGNOSIS — R1011 Right upper quadrant pain: Secondary | ICD-10-CM

## 2020-01-16 DIAGNOSIS — N23 Unspecified renal colic: Secondary | ICD-10-CM | POA: Diagnosis not present

## 2020-01-16 LAB — URINALYSIS, COMPLETE
Bilirubin, UA: NEGATIVE
Glucose, UA: NEGATIVE
Ketones, UA: NEGATIVE
Leukocytes,UA: NEGATIVE
Nitrite, UA: NEGATIVE
Protein,UA: NEGATIVE
Specific Gravity, UA: 1.015 (ref 1.005–1.030)
Urobilinogen, Ur: 0.2 mg/dL (ref 0.2–1.0)
pH, UA: 6 (ref 5.0–7.5)

## 2020-01-16 LAB — MICROSCOPIC EXAMINATION
Bacteria, UA: NONE SEEN
Epithelial Cells (non renal): NONE SEEN /hpf (ref 0–10)
Renal Epithel, UA: NONE SEEN /hpf
WBC, UA: NONE SEEN /hpf (ref 0–5)

## 2020-01-16 MED ORDER — CIPROFLOXACIN HCL 500 MG PO TABS: 500.0000 mg | ORAL_TABLET | Freq: Two times a day (BID) | ORAL | 0 refills | Status: DC

## 2020-01-16 MED ORDER — HYDROCODONE-ACETAMINOPHEN 5-325 MG PO TABS: 1.0000 | ORAL_TABLET | ORAL | 0 refills | Status: AC | PRN

## 2020-01-16 NOTE — Addendum Note (Signed)
Addended by: Claretta Fraise on: 01/16/2020 11:56 AM   Modules accepted: Orders

## 2020-01-16 NOTE — Progress Notes (Addendum)
Subjective:  Patient ID: Earl Martinez, male    DOB: 1961/09/23  Age: 59 y.o. MRN: [id number]  CC: Dysuria, Back Pain, and Burning with Urination   HPI Earl Martinez presents for right flank pain starting at 3:00 this morning.  Sudden onset awoken from sleep.  The initial pain was 10/10 and lasted till about 5 AM.  He took 2 muscle relaxers and tramadol with partial relief.  He has never had a kidney stone before.  He had one episode of vomiting.  He says he drank 7 bottles of water 1/2 L each.  This morning between 3:00 and 11:00.  He did have 1 episode of vomiting.  Depression screen Earl Martinez 2/9 01/16/2020 12/27/2018 05/17/2018  Decreased Interest 0 0 0  Down, Depressed, Hopeless 0 0 0  PHQ - 2 Score 0 0 0  Altered sleeping - - -  Tired, decreased energy - - -  Change in appetite - - -  Feeling bad or failure about yourself  - - -  Trouble concentrating - - -  Moving slowly or fidgety/restless - - -  Suicidal thoughts - - -  PHQ-9 Score - - -  Difficult doing work/chores - - -    History Earl Martinez has a past medical history of Hyperlipemia, Neck pain, Rectal abscess, and Seasonal allergies.   He has a past surgical history that includes Rectal surgery (09/19/11); Neck surgery; Nasal septum surgery; Back surgery; and Rotator cuff repair (Right, 02/19/2017).   His family history is not on file.He reports that he has never smoked. He has never used smokeless tobacco. He reports that he does not drink alcohol or use drugs.    ROS Review of Systems  Constitutional: Negative for fever.  Respiratory: Negative for shortness of breath.   Cardiovascular: Negative for chest pain.  Gastrointestinal: Positive for abdominal pain.  Musculoskeletal: Negative for arthralgias.  Skin: Negative for rash.    Objective:  BP 133/82   Pulse 64   Temp 98.4 F (36.9 C) (Temporal)   Ht 5' 11.5" (1.816 m)   Wt 208 lb 3.2 oz (94.4 kg)   BMI 28.63 kg/m   BP Readings from Last 3 Encounters:    01/16/20 133/82  12/27/18 127/75  05/17/18 130/87    Wt Readings from Last 3 Encounters:  01/16/20 208 lb 3.2 oz (94.4 kg)  12/27/18 204 lb (92.5 kg)  05/17/18 202 lb 3.2 oz (91.7 kg)     Physical Exam Vitals reviewed.  Constitutional:      Appearance: He is well-developed.  HENT:     Head: Normocephalic and atraumatic.     Right Ear: External ear normal.     Left Ear: External ear normal.     Mouth/Throat:     Pharynx: No oropharyngeal exudate or posterior oropharyngeal erythema.  Eyes:     Pupils: Pupils are equal, round, and reactive to light.  Cardiovascular:     Rate and Rhythm: Normal rate and regular rhythm.     Heart sounds: No murmur.  Pulmonary:     Effort: No respiratory distress.     Breath sounds: Normal breath sounds.  Abdominal:     Tenderness: There is abdominal tenderness (right flank.).  Musculoskeletal:     Cervical back: Normal range of motion and neck supple.  Neurological:     Mental Status: He is alert and oriented to person, place, and time.     Microscopic urinalysis shows 11-20 red cells per high-power field.  Negative  for bacteria.  Culture sent.  Assessment & Plan:   Earl Martinez was seen today for dysuria, back pain and burning with urination.  Diagnoses and all orders for this visit:  Renal colic on right side -     Microscopic Examination  Pain with urination -     Urinalysis, Complete -     Urine Culture; Future -     Urine Culture -     Microscopic Examination  Right upper quadrant abdominal pain -     CT RENAL STONE STUDY; Future -     Microscopic Examination  Other orders -     ciprofloxacin (CIPRO) 500 MG tablet; Take 1 tablet (500 mg total) by mouth 2 (two) times daily. -     HYDROcodone-acetaminophen (NORCO) 5-325 MG tablet; Take 1 tablet by mouth every 4 (four) hours as needed for up to 5 days for moderate pain or severe pain. Take for moderate to severe pain       [name]  and HYDROcodone-acetaminophen. I am also having him maintain his fish oil-omega-3 fatty acids, multivitamin, docusate sodium, cetirizine, fluticasone, atorvastatin, and tamsulosin.  Allergies as of 01/16/2020   No Known Allergies     Medication List       Accurate as of January 16, 2020 11:56 AM. If you have any questions, ask your nurse or doctor.        atorvastatin 40 MG tablet Commonly known as: LIPITOR Take 1 tablet (40 mg total) by mouth daily.   cetirizine 10 MG tablet Commonly known as: ZYRTEC Take 1 tablet (10 mg total) by mouth daily.   ciprofloxacin 500 MG tablet Commonly known as: Cipro Take 1 tablet (500 mg total) by mouth 2 (two) times daily. Started by: Claretta Fraise, MD   docusate sodium 100 MG capsule Commonly known as: COLACE Take 100 mg by mouth daily as needed for mild constipation.   fish oil-omega-3 fatty acids 1000 MG capsule Take 2 g by mouth daily.   fluticasone 50 MCG/ACT nasal spray Commonly known as: FLONASE SPRAY 2 SPRAYS INTO EACH NOSTRIL EVERY DAY   HYDROcodone-acetaminophen 5-325 MG tablet Commonly known as: Norco Take 1 tablet by mouth every 4 (four) hours as needed for up to 5 days for moderate pain or severe pain. Take for moderate to severe pain Started by: Claretta Fraise, MD   multivitamin tablet Take 1 tablet by mouth daily.   tamsulosin 0.4 MG Caps capsule Commonly known as: FLOMAX Take 1 capsule (0.4 mg total) by mouth daily.      Patient is having 10/10 pain starting at 3:00 this morning.  It is been intermittent it is been at the right flank highly suggestive along with hematuria of a renal stone.  He is being sent for a stat CT scan with renal stone protocol.  For now he has taken a muscle relaxer and tramadol and does not desire pain shot.  We will be managing for pain looking for hydronephrosis and treat for possible infection as well.  Follow-up: No follow-ups on file.  Claretta Fraise, M.D.

## 2020-01-17 LAB — URINE CULTURE: Organism ID, Bacteria: NO GROWTH

## 2020-03-19 ENCOUNTER — Other Ambulatory Visit: Payer: Self-pay | Admitting: Family Medicine

## 2020-03-19 DIAGNOSIS — R350 Frequency of micturition: Secondary | ICD-10-CM

## 2020-03-19 DIAGNOSIS — N401 Enlarged prostate with lower urinary tract symptoms: Secondary | ICD-10-CM

## 2020-04-11 ENCOUNTER — Ambulatory Visit: Payer: 59 | Admitting: Family Medicine

## 2020-04-11 ENCOUNTER — Other Ambulatory Visit: Payer: Self-pay

## 2020-04-11 VITALS — BP 132/88 | HR 71 | Temp 97.9°F | Ht 71.0 in | Wt 204.0 lb

## 2020-04-11 DIAGNOSIS — S30865A Insect bite (nonvenomous) of unspecified external genital organs, male, initial encounter: Secondary | ICD-10-CM | POA: Diagnosis not present

## 2020-04-11 DIAGNOSIS — W57XXXA Bitten or stung by nonvenomous insect and other nonvenomous arthropods, initial encounter: Secondary | ICD-10-CM

## 2020-04-11 MED ORDER — DOXYCYCLINE HYCLATE 100 MG PO TABS: ORAL_TABLET | ORAL | 0 refills | Status: DC

## 2020-04-11 NOTE — Patient Instructions (Signed)

## 2020-04-11 NOTE — Progress Notes (Signed)
Subjective: CC: Tick bite PCP: Claretta Fraise, MD [id number] ZYDEN SEEGARS is a 59 y.o. male presenting to clinic today for:  1.  Tick bite Patient reports that he found a tick on the inferior aspect of his scrotum this morning.  He had some discomfort yesterday but when he looked at it he actually saw the body of the tick.  He is not sure how long this was attached for.  He denies any body aches, headaches, nausea, vomiting, rashes, target lesions.  He does note some purple discoloration of the area of bite.   ROS: Per HPI  No Known Allergies Past Medical History:  Diagnosis Date  . Hyperlipemia   . Neck pain   . Rectal abscess   . Seasonal allergies     Current Outpatient Medications:  .  atorvastatin (LIPITOR) 40 MG tablet, Take 1 tablet (40 mg total) by mouth daily., Disp: 90 tablet, Rfl: 1 .  cetirizine (ZYRTEC) 10 MG tablet, Take 1 tablet (10 mg total) by mouth daily., Disp: 30 tablet, Rfl: 11 .  docusate sodium (COLACE) 100 MG capsule, Take 100 mg by mouth daily as needed for mild constipation., Disp: , Rfl:  .  fish oil-omega-3 fatty acids 1000 MG capsule, Take 2 g by mouth daily., Disp: , Rfl:  .  fluticasone (FLONASE) 50 MCG/ACT nasal spray, SPRAY 2 SPRAYS INTO EACH NOSTRIL EVERY DAY, Disp: 48 mL, Rfl: 1 .  ipratropium (ATROVENT) 0.06 % nasal spray, 2 sprays by Each Nare route Three (3) times a day., Disp: , Rfl:  .  Multiple Vitamin (MULTIVITAMIN) tablet, Take 1 tablet by mouth daily., Disp: , Rfl:  .  omeprazole (PRILOSEC) 20 MG capsule, Take 20 mg by mouth 2 (two) times daily., Disp: , Rfl:  .  tamsulosin (FLOMAX) 0.4 MG CAPS capsule, TAKE 1 CAPSULE BY MOUTH EVERY DAY, Disp: 90 capsule, Rfl: 0 Social History   Socioeconomic History  . Marital status: Married    Spouse name: Not on file  . Number of children: Not on file  . Years of education: Not on file  . Highest education level: Not on file  Occupational History  . Not on file  Tobacco Use  . Smoking status:  Never Smoker  . Smokeless tobacco: Never Used  Substance and Sexual Activity  . Alcohol use: No  . Drug use: No  . Sexual activity: Not on file  Other Topics Concern  . Not on file  Social History Narrative  . Not on file   Social Determinants of Health   Financial Resource Strain:   . Difficulty of Paying Living Expenses:   Food Insecurity:   . Worried About Charity fundraiser in the Last Year:   . Arboriculturist in the Last Year:   Transportation Needs:   . Film/video editor (Medical):   Marland Kitchen Lack of Transportation (Non-Medical):   Physical Activity:   . Days of Exercise per Week:   . Minutes of Exercise per Session:   Stress:   . Feeling of Stress :   Social Connections:   . Frequency of Communication with Friends and Family:   . Frequency of Social Gatherings with Friends and Family:   . Attends Religious Services:   . Active Member of Clubs or Organizations:   . Attends Archivist Meetings:   Marland Kitchen Marital Status:   Intimate Partner Violence:   . Fear of Current or Ex-Partner:   . Emotionally Abused:   Marland Kitchen Physically  Abused:   . Sexually Abused:    Family History  Problem Relation Age of Onset  . Colon cancer Neg Hx   . Pancreatic cancer Neg Hx   . Stomach cancer Neg Hx     Objective: Office vital signs reviewed. BP 132/88   Pulse 71   Temp 97.9 F (36.6 C) (Temporal)   Ht 5\' 11"  (1.803 m)   Wt 204 lb (92.5 kg)   SpO2 97%   BMI 28.45 kg/m   Physical Examination:  General: Awake, alert, well nourished, No acute distress Skin: Right side of inferior scrotum with 2 visible punctum with central ecchymosis.  He has a a tiny abrasion.  There is a single piece of tick still attached inferiorly.  This was removed easily with sterile pickups.  Assessment/ Plan: 59 y.o. male   1. Tick bite, initial encounter No evidence of Lyme disease at this point.  I am going to go ahead and prophylax him with one 200 mg dose of doxycycline given unknown  duration of attachment of the take it.  We discussed red flag signs and symptoms warranting proceeding with the twice daily dosing of the doxycycline.  He voiced good understanding and will follow up as needed - doxycycline (VIBRA-TABS) 100 MG tablet; Take 2 tablets as a single dose today.  If you develop any symptoms of Lyme, start 1 tablet twice daily until gone.  Dispense: 20 tablet; Refill: 0   No orders of the defined types were placed in this encounter.  No orders of the defined types were placed in this encounter.    Janora Norlander, DO [place] [phone]

## 2020-06-15 ENCOUNTER — Other Ambulatory Visit: Payer: Self-pay | Admitting: Family Medicine

## 2020-06-15 DIAGNOSIS — N401 Enlarged prostate with lower urinary tract symptoms: Secondary | ICD-10-CM

## 2020-06-15 NOTE — Telephone Encounter (Signed)
30 day supply sent in today, ntbs for further refills  Hasn't had any labs drawn since 2019

## 2020-06-24 ENCOUNTER — Other Ambulatory Visit: Payer: Self-pay | Admitting: Family Medicine

## 2020-06-24 DIAGNOSIS — E785 Hyperlipidemia, unspecified: Secondary | ICD-10-CM

## 2020-07-08 ENCOUNTER — Other Ambulatory Visit: Payer: Self-pay | Admitting: Family Medicine

## 2020-07-08 DIAGNOSIS — N401 Enlarged prostate with lower urinary tract symptoms: Secondary | ICD-10-CM

## 2020-07-08 DIAGNOSIS — E785 Hyperlipidemia, unspecified: Secondary | ICD-10-CM

## 2020-07-09 NOTE — Telephone Encounter (Signed)
Pt states he will have to call back to schedule an appt.

## 2020-07-09 NOTE — Telephone Encounter (Signed)
Stacks. NTBS 30 days given 06/15/20

## 2020-07-16 ENCOUNTER — Other Ambulatory Visit: Payer: Self-pay | Admitting: Family Medicine

## 2020-07-16 DIAGNOSIS — E785 Hyperlipidemia, unspecified: Secondary | ICD-10-CM

## 2020-07-23 ENCOUNTER — Telehealth: Payer: Self-pay | Admitting: Family Medicine

## 2020-07-23 DIAGNOSIS — R350 Frequency of micturition: Secondary | ICD-10-CM

## 2020-07-23 DIAGNOSIS — N401 Enlarged prostate with lower urinary tract symptoms: Secondary | ICD-10-CM

## 2020-07-23 MED ORDER — TAMSULOSIN HCL 0.4 MG PO CAPS: 0.4000 mg | ORAL_CAPSULE | Freq: Every day | ORAL | 0 refills | Status: DC

## 2020-07-23 NOTE — Telephone Encounter (Signed)
  Prescription Request  07/23/2020  What is the name of the medication or equipment? tamsulosin (FLOMAX) 0.4 MG CAPS capsule  Have you contacted your pharmacy to request a refill? (if applicable) no--because he needs an apt which he does 07/31/2020  Which pharmacy would you like this sent to? [facility] in [place]   Patient notified that their request is being sent to the clinical staff for review and that they should receive a response within 2 business days.   07/31/2020 with Ardeen Fillers

## 2020-07-23 NOTE — Telephone Encounter (Signed)
30 day supply sent to pharmacy- patient aware.

## 2020-07-31 ENCOUNTER — Ambulatory Visit (INDEPENDENT_AMBULATORY_CARE_PROVIDER_SITE_OTHER): Payer: 59 | Admitting: Nurse Practitioner

## 2020-07-31 ENCOUNTER — Encounter: Payer: Self-pay | Admitting: Nurse Practitioner

## 2020-07-31 ENCOUNTER — Other Ambulatory Visit: Payer: Self-pay

## 2020-07-31 VITALS — BP 125/81 | HR 54 | Temp 99.0°F | Resp 20 | Ht 71.0 in | Wt 199.0 lb

## 2020-07-31 DIAGNOSIS — Z0000 Encounter for general adult medical examination without abnormal findings: Secondary | ICD-10-CM | POA: Diagnosis not present

## 2020-07-31 DIAGNOSIS — Z23 Encounter for immunization: Secondary | ICD-10-CM

## 2020-07-31 DIAGNOSIS — E785 Hyperlipidemia, unspecified: Secondary | ICD-10-CM | POA: Diagnosis not present

## 2020-07-31 NOTE — Assessment & Plan Note (Signed)
Patient is a 59 year old male who presents to clinic for an annual physical exam.  Patient has not had a physical in the last 3 years.  Patient has no new concerns.  Completed head to toe assessment.  Provided education for health maintenance and preventative care.  Patient receive a tetanus vaccine today, provided education on the shingles vaccine, patient will wait to receive vaccine. Labs completed today: CBC, CMP, lipid panel, and PSA. Printed handouts given to patient Follow-up in 1 year

## 2020-07-31 NOTE — Progress Notes (Signed)
Established Patient Office Visit  Subjective:  Patient ID: Earl Martinez, male    DOB: Mar 06, 1961  Age: 59 y.o. MRN: [id number]  CC:  Chief Complaint  Patient presents with   Annual Exam    yearly    Hyperlipidemia    HPI DIETRICK BARRIS Presents for encounter for general adult medical examination without abnormal findings  Physical Patient's last physical exam was 3 year ago .  Weight: Appropriate for height (BMI greater than 27%) ;  Blood Pressure: Normal (BP greater than 120/80) ; 125/81 Medical History: Patient history reviewed ; Family history reviewed ;  Allergies Reviewed: No change in current allergies ;  Medications Reviewed: Medications reviewed - no changes ;  Lipids: Normal lipid levels ; lipid panels collected Smoking: Life-long non-smoker ;  Physical Activity: Exercises at least 3 times per week ;  Alcohol/Drug Use: Is a non-drinker ; No illicit drug use ;  Patient is not afflicted from Stress Incontinence and Urge Incontinence  Safety: reviewed ; Patient wears a seat belt, has smoke detectors, has carbon monoxide detectors, practices appropriate gun safety, and does not wear sunscreen with extended sun exposure. Dental Care: Annual l cleanings, brushes and flosses daily. Ophthalmology/Optometry: Annual visit.  Wears glasses Hearing loss: none Vision impairments: none/     presents for Mixed hyperlipidemia  Pt presents with hyperlipidemia. Patient was diagnosed in .  03/25/19 12 compliance with treatment has been good.  The patient is compliant with medications, maintains a low cholesterol diet , follows up as directed , and maintains an exercise regimen . The patient denies experiencing any hypercholesterolemia related symptoms.  Current medication atorvastatin 40 mg daily.     Past Medical History:  Diagnosis Date   Hyperlipemia    Neck pain    Rectal abscess    Seasonal allergies     Past Surgical History:  Procedure Laterality Date   BACK  SURGERY     NASAL SEPTUM SURGERY     NECK SURGERY     RECTAL SURGERY  09/19/11   rectal abscess   ROTATOR CUFF REPAIR Right 02/19/2017   Dr. Theda Sers, [place]    Family History  Problem Relation Age of Onset   Colon cancer Neg Hx    Pancreatic cancer Neg Hx    Stomach cancer Neg Hx     Social History   Socioeconomic History   Marital status: Married    Spouse name: Not on file   Number of children: Not on file   Years of education: Not on file   Highest education level: Not on file  Occupational History   Not on file  Tobacco Use   Smoking status: Never Smoker   Smokeless tobacco: Never Used  Substance and Sexual Activity   Alcohol use: No   Drug use: No   Sexual activity: Not on file  Other Topics Concern   Not on file  Social History Narrative   Not on file      Outpatient Medications Prior to Visit  Medication Sig Dispense Refill   atorvastatin (LIPITOR) 40 MG tablet Take 1 tablet (40 mg total) by mouth daily. 30 tablet 0   cetirizine (ZYRTEC) 10 MG tablet Take 1 tablet (10 mg total) by mouth daily. 30 tablet 11   docusate sodium (COLACE) 100 MG capsule Take 100 mg by mouth daily as needed for mild constipation.     fish oil-omega-3 fatty acids 1000 MG capsule Take 2 g by mouth daily.  fluticasone (FLONASE) 50 MCG/ACT nasal spray SPRAY 2 SPRAYS INTO EACH NOSTRIL EVERY DAY 48 mL 1   Multiple Vitamin (MULTIVITAMIN) tablet Take 1 tablet by mouth daily.     omeprazole (PRILOSEC) 20 MG capsule Take 20 mg by mouth 2 (two) times daily.     tamsulosin (FLOMAX) 0.4 MG CAPS capsule Take 1 capsule (0.4 mg total) by mouth daily. Needs appointment for further refills 30 capsule 0   doxycycline (VIBRA-TABS) 100 MG tablet Take 2 tablets as a single dose today.  If you develop any symptoms of Lyme, start 1 tablet twice daily until gone. 20 tablet 0   ipratropium (ATROVENT) 0.06 % nasal spray 2 sprays by Each Nare route Three (3) times a  day.     No facility-administered medications prior to visit.    No Known Allergies  ROS Review of Systems  Constitutional: Negative.   HENT: Negative.   Eyes:       Wears glasses  Respiratory: Negative.   Cardiovascular: Negative.   Gastrointestinal: Negative.   Genitourinary:       Urinary hesitancy  Musculoskeletal: Negative.   Skin: Negative.   Psychiatric/Behavioral: Negative.       Objective:    Physical Exam Vitals reviewed.  Constitutional:      Appearance: Normal appearance. He is normal weight.  HENT:     Head: Normocephalic.     Right Ear: External ear normal. There is no impacted cerumen.     Left Ear: External ear normal. There is no impacted cerumen.     Nose: Nose normal.     Mouth/Throat:     Mouth: Mucous membranes are moist.     Pharynx: Oropharynx is clear.  Eyes:     Conjunctiva/sclera: Conjunctivae normal.  Cardiovascular:     Rate and Rhythm: Normal rate and regular rhythm.     Pulses: Normal pulses.     Heart sounds: Normal heart sounds.  Pulmonary:     Effort: Pulmonary effort is normal.     Breath sounds: Normal breath sounds.  Abdominal:     General: Bowel sounds are normal. There is no distension.     Tenderness: There is no abdominal tenderness.  Musculoskeletal:        General: Normal range of motion.     Cervical back: Normal range of motion.  Skin:    General: Skin is warm.  Neurological:     Mental Status: He is alert and oriented to person, place, and time.  Psychiatric:        Mood and Affect: Mood normal.        Behavior: Behavior normal.     BP 125/81    Pulse (!) 54    Temp 99 F (37.2 C)    Resp 20    Ht 5' 11"  (1.803 m)    Wt 199 lb (90.3 kg)    SpO2 99%    BMI 27.75 kg/m  Wt Readings from Last 3 Encounters:  07/31/20 199 lb (90.3 kg)  04/11/20 204 lb (92.5 kg)  01/16/20 208 lb 3.2 oz (94.4 kg)     There are no preventive care reminders to display for this patient.   Lab Results  Component Value Date     TSH 3.360 07/31/2014   Lab Results  Component Value Date   WBC 13.7 (A) 06/12/2015   HGB 14.3 06/12/2015   HCT 44.8 06/12/2015   MCV 85.9 06/12/2015   PLT 206 09/19/2011   Lab Results  Component  Value Date   NA 141 05/17/2018   K 5.0 05/17/2018   CO2 25 05/17/2018   GLUCOSE 103 (H) 05/17/2018   BUN 15 05/17/2018   CREATININE 0.97 05/17/2018   BILITOT 0.3 06/12/2015   ALKPHOS 74 06/12/2015   AST 22 06/12/2015   ALT 26 06/12/2015   PROT 6.8 06/12/2015   ALBUMIN 4.3 06/12/2015   CALCIUM 9.8 05/17/2018   Lab Results  Component Value Date   CHOL 132 05/17/2018   Lab Results  Component Value Date   HDL 46 05/17/2018   Lab Results  Component Value Date   LDLCALC 72 05/17/2018   Lab Results  Component Value Date   TRIG 71 05/17/2018   Lab Results  Component Value Date   CHOLHDL 2.9 05/17/2018   No results found for: HGBA1C    Assessment & Plan:   Problem List Items Addressed This Visit      Other   Hyperlipidemia    Patient is a 59 year old male who presents to clinic for follow-up mixed hyperlipidemia.  Patient was diagnosed in 03/25/2019, compliance with treatment has been good.  Patient is compliant with medication, maintains a low cholesterol diet follows up as directed and maintains an exercise regimen.  Currently patient is on atorvastatin 40 mg daily.  Patient has not complained of any side effect from medication or reported any hypercholesterolemia related symptoms. Lipid panels collected pending labs. Provided education on health maintenance and diet.  Printed handouts given to patient. Follow-up in 6 months.      Relevant Orders   CMP14+EGFR   Lipid panel   Annual physical exam - Primary    Patient is a 58 year old male who presents to clinic for an annual physical exam.  Patient has not had a physical in the last 3 years.  Patient has no new concerns.  Completed head to toe assessment.  Provided education for health maintenance and preventative  care.  Patient receive a tetanus vaccine today, provided education on the shingles vaccine, patient will wait to receive vaccine. Labs completed today: CBC, CMP, lipid panel, and PSA. Printed handouts given to patient Follow-up in 1 year      Relevant Orders   CBC with Differential/Platelet   CMP14+EGFR   Lipid panel   PSA, total and free        Follow-up: Return in about 6 months (around 01/31/2021).    Ivy Lynn, NP

## 2020-07-31 NOTE — Assessment & Plan Note (Signed)
Patient is a 59 year old male who presents to clinic for follow-up mixed hyperlipidemia.  Patient was diagnosed in 03/25/2019, compliance with treatment has been good.  Patient is compliant with medication, maintains a low cholesterol diet follows up as directed and maintains an exercise regimen.  Currently patient is on atorvastatin 40 mg daily.  Patient has not complained of any side effect from medication or reported any hypercholesterolemia related symptoms. Lipid panels collected pending labs. Provided education on health maintenance and diet.  Printed handouts given to patient. Follow-up in 6 months.

## 2020-07-31 NOTE — Patient Instructions (Addendum)
Hyperlipidemia Patient is a 59 year old male who presents to clinic for follow-up mixed hyperlipidemia.  Patient was diagnosed in 03/25/2019, compliance with treatment has been good.  Patient is compliant with medication, maintains a low cholesterol diet follows up as directed and maintains an exercise regimen.  Currently patient is on atorvastatin 40 mg daily.  Patient has not complained of any side effect from medication or reported any hypercholesterolemia related symptoms. Lipid panels collected pending labs. Provided education on health maintenance and diet.  Printed handouts given to patient. Follow-up in 6 months.  Annual physical exam Patient is a 59 year old male who presents to clinic for an annual physical exam.  Patient has not had a physical in the last 3 years.  Patient has no new concerns.  Completed head to toe assessment.  Provided education for health maintenance and preventative care.  Patient receive a tetanus vaccine today, provided education on the shingles vaccine, patient will wait to receive vaccine. Labs completed today: CBC, CMP, lipid panel, and PSA. Printed handouts given to patient Follow-up in 1 year   Health Maintenance, Male Adopting a healthy lifestyle and getting preventive care are important in promoting health and wellness. Ask your health care provider about:  The right schedule for you to have regular tests and exams.  Things you can do on your own to prevent diseases and keep yourself healthy. What should I know about diet, weight, and exercise? Eat a healthy diet   Eat a diet that includes plenty of vegetables, fruits, low-fat dairy products, and lean protein.  Do not eat a lot of foods that are high in solid fats, added sugars, or sodium. Maintain a healthy weight Body mass index (BMI) is a measurement that can be used to identify possible weight problems. It estimates body fat based on height and weight. Your health care provider can help determine  your BMI and help you achieve or maintain a healthy weight. Get regular exercise Get regular exercise. This is one of the most important things you can do for your health. Most adults should:  Exercise for at least 150 minutes each week. The exercise should increase your heart rate and make you sweat (moderate-intensity exercise).  Do strengthening exercises at least twice a week. This is in addition to the moderate-intensity exercise.  Spend less time sitting. Even light physical activity can be beneficial. Watch cholesterol and blood lipids Have your blood tested for lipids and cholesterol at 57 years of age, then have this test every 5 years. You may need to have your cholesterol levels checked more often if:  Your lipid or cholesterol levels are high.  You are older than 57 years of age.  You are at high risk for heart disease. What should I know about cancer screening? Many types of cancers can be detected early and may often be prevented. Depending on your health history and family history, you may need to have cancer screening at various ages. This may include screening for:  Colorectal cancer.  Prostate cancer.  Skin cancer.  Lung cancer. What should I know about heart disease, diabetes, and high blood pressure? Blood pressure and heart disease  High blood pressure causes heart disease and increases the risk of stroke. This is more likely to develop in people who have high blood pressure readings, are of African descent, or are overweight.  Talk with your health care provider about your target blood pressure readings.  Have your blood pressure checked: ? Every 3-5 years if you are  59-70 years of age. ? Every year if you are 56 years old or older.  If you are between the ages of 42 and 2 and are a current or former smoker, ask your health care provider if you should have a one-time screening for abdominal aortic aneurysm (AAA). Diabetes Have regular diabetes  screenings. This checks your fasting blood sugar level. Have the screening done:  Once every three years after age 29 if you are at a normal weight and have a low risk for diabetes.  More often and at a younger age if you are overweight or have a high risk for diabetes. What should I know about preventing infection? Hepatitis B If you have a higher risk for hepatitis B, you should be screened for this virus. Talk with your health care provider to find out if you are at risk for hepatitis B infection. Hepatitis C Blood testing is recommended for:  Everyone born from [birth year] through 1963.  Anyone with known risk factors for hepatitis C. Sexually transmitted infections (STIs)  You should be screened each year for STIs, including gonorrhea and chlamydia, if: ? You are sexually active and are younger than 57 years of age. ? You are older than 57 years of age and your health care provider tells you that you are at risk for this type of infection. ? Your sexual activity has changed since you were last screened, and you are at increased risk for chlamydia or gonorrhea. Ask your health care provider if you are at risk.  Ask your health care provider about whether you are at high risk for HIV. Your health care provider may recommend a prescription medicine to help prevent HIV infection. If you choose to take medicine to prevent HIV, you should first get tested for HIV. You should then be tested every 3 months for as long as you are taking the medicine. Follow these instructions at home: Lifestyle  Do not use any products that contain nicotine or tobacco, such as cigarettes, e-cigarettes, and chewing tobacco. If you need help quitting, ask your health care provider.  Do not use street drugs.  Do not share needles.  Ask your health care provider for help if you need support or information about quitting drugs. Alcohol use  Do not drink alcohol if your health care provider tells you not to  drink.  If you drink alcohol: ? Limit how much you have to 0-2 drinks a day. ? Be aware of how much alcohol is in your drink. In the [place] one drink equals one 12 oz bottle of beer (355 mL), one 5 oz glass of wine (148 mL), or one 1 oz glass of hard liquor (44 mL). General instructions  Schedule regular health, dental, and eye exams.  Stay current with your vaccines.  Tell your health care provider if: ? You often feel depressed. ? You have ever been abused or do not feel safe at home. Summary  Adopting a healthy lifestyle and getting preventive care are important in promoting health and wellness.  Follow your health care provider's instructions about healthy diet, exercising, and getting tested or screened for diseases.  Follow your health care provider's instructions on monitoring your cholesterol and blood pressure. This information is not intended to replace advice given to you by your health care provider. Make sure you discuss any questions you have with your health care provider. Document Revised: 11/17/2018 Document Reviewed: 11/17/2018 Elsevier Patient Education  [place]. High Cholesterol  High cholesterol  is a condition in which the blood has high levels of a white, waxy, fat-like substance (cholesterol). The human body needs small amounts of cholesterol. The liver makes all the cholesterol that the body needs. Extra (excess) cholesterol comes from the food that we eat. Cholesterol is carried from the liver by the blood through the blood vessels. If you have high cholesterol, deposits (plaques) may build up on the walls of your blood vessels (arteries). Plaques make the arteries narrower and stiffer. Cholesterol plaques increase your risk for heart attack and stroke. Work with your health care provider to keep your cholesterol levels in a healthy range. What increases the risk? This condition is more likely to develop in people who:  Eat foods that are high in  animal fat (saturated fat) or cholesterol.  Are overweight.  Are not getting enough exercise.  Have a family history of high cholesterol. What are the signs or symptoms? There are no symptoms of this condition. How is this diagnosed? This condition may be diagnosed from the results of a blood test.  If you are older than age 39, your health care provider may check your cholesterol every 4-6 years.  You may be checked more often if you already have high cholesterol or other risk factors for heart disease. The blood test for cholesterol measures:  "Bad" cholesterol (LDL cholesterol). This is the main type of cholesterol that causes heart disease. The desired level for LDL is less than 100.  "Good" cholesterol (HDL cholesterol). This type helps to protect against heart disease by cleaning the arteries and carrying the LDL away. The desired level for HDL is 60 or higher.  Triglycerides. These are fats that the body can store or burn for energy. The desired number for triglycerides is lower than 150.  Total cholesterol. This is a measure of the total amount of cholesterol in your blood, including LDL cholesterol, HDL cholesterol, and triglycerides. A healthy number is less than 200. How is this treated? This condition is treated with diet changes, lifestyle changes, and medicines. Diet changes  This may include eating more whole grains, fruits, vegetables, nuts, and fish.  This may also include cutting back on red meat and foods that have a lot of added sugar. Lifestyle changes  Changes may include getting at least 40 minutes of aerobic exercise 3 times a week. Aerobic exercises include walking, biking, and swimming. Aerobic exercise along with a healthy diet can help you maintain a healthy weight.  Changes may also include quitting smoking. Medicines  Medicines are usually given if diet and lifestyle changes have failed to reduce your cholesterol to healthy levels.  Your health  care provider may prescribe a statin medicine. Statin medicines have been shown to reduce cholesterol, which can reduce the risk of heart disease. Follow these instructions at home: Eating and drinking If told by your health care provider:  Eat chicken (without skin), fish, veal, shellfish, ground [place] breast, and round or loin cuts of red meat.  Do not eat fried foods or fatty meats, such as hot dogs and salami.  Eat plenty of fruits, such as apples.  Eat plenty of vegetables, such as broccoli, potatoes, and carrots.  Eat beans, peas, and lentils.  Eat grains such as barley, rice, couscous, and bulgur wheat.  Eat pasta without cream sauces.  Use skim or nonfat milk, and eat low-fat or nonfat yogurt and cheeses.  Do not eat or drink whole milk, cream, ice cream, egg yolks, or hard cheeses.  Do not eat stick margarine or tub margarines that contain trans fats (also called partially hydrogenated oils).  Do not eat saturated tropical oils, such as coconut oil and palm oil.  Do not eat cakes, cookies, crackers, or other baked goods that contain trans fats.  General instructions  Exercise as directed by your health care provider. Increase your activity level with activities such as gardening, walking, and taking the stairs.  Take over-the-counter and prescription medicines only as told by your health care provider.  Do not use any products that contain nicotine or tobacco, such as cigarettes and e-cigarettes. If you need help quitting, ask your health care provider.  Keep all follow-up visits as told by your health care provider. This is important. Contact a health care provider if:  You are struggling to maintain a healthy diet or weight.  You need help to start on an exercise program.  You need help to stop smoking. Get help right away if:  You have chest pain.  You have trouble breathing. This information is not intended to replace advice given to you by your health  care provider. Make sure you discuss any questions you have with your health care provider. Document Revised: 11/27/2017 Document Reviewed: 05/24/2016 Elsevier Patient Education  [place].

## 2020-08-01 LAB — CBC WITH DIFFERENTIAL/PLATELET
Basophils Absolute: 0.1 10*3/uL (ref 0.0–0.2)
Basos: 1 %
EOS (ABSOLUTE): 0.3 10*3/uL (ref 0.0–0.4)
Eos: 5 %
Hematocrit: 40.8 % (ref 37.5–51.0)
Hemoglobin: 13.6 g/dL (ref 13.0–17.7)
Immature Grans (Abs): 0 10*3/uL (ref 0.0–0.1)
Immature Granulocytes: 0 %
Lymphocytes Absolute: 1.4 10*3/uL (ref 0.7–3.1)
Lymphs: 23 %
MCH: 28.9 pg (ref 26.6–33.0)
MCHC: 33.3 g/dL (ref 31.5–35.7)
MCV: 87 fL (ref 79–97)
Monocytes Absolute: 0.5 10*3/uL (ref 0.1–0.9)
Monocytes: 8 %
Neutrophils Absolute: 3.8 10*3/uL (ref 1.4–7.0)
Neutrophils: 63 %
Platelets: 155 10*3/uL (ref 150–450)
RBC: 4.71 x10E6/uL (ref 4.14–5.80)
RDW: 12.8 % (ref 11.6–15.4)
WBC: 6 10*3/uL (ref 3.4–10.8)

## 2020-08-01 LAB — CMP14+EGFR
ALT: 17 IU/L (ref 0–44)
AST: 17 IU/L (ref 0–40)
Albumin/Globulin Ratio: 2 (ref 1.2–2.2)
Albumin: 4.5 g/dL (ref 3.8–4.9)
Alkaline Phosphatase: 78 IU/L (ref 48–121)
BUN/Creatinine Ratio: 23 — ABNORMAL HIGH (ref 9–20)
BUN: 22 mg/dL (ref 6–24)
Bilirubin Total: 0.4 mg/dL (ref 0.0–1.2)
CO2: 26 mmol/L (ref 20–29)
Calcium: 9.7 mg/dL (ref 8.7–10.2)
Chloride: 106 mmol/L (ref 96–106)
Creatinine, Ser: 0.94 mg/dL (ref 0.76–1.27)
GFR calc Af Amer: 104 mL/min/{1.73_m2} (ref >59)
GFR calc non Af Amer: 90 mL/min/{1.73_m2} (ref >59)
Globulin, Total: 2.2 g/dL (ref 1.5–4.5)
Glucose: 101 mg/dL — ABNORMAL HIGH (ref 65–99)
Potassium: 4 mmol/L (ref 3.5–5.2)
Sodium: 143 mmol/L (ref 134–144)
Total Protein: 6.7 g/dL (ref 6.0–8.5)

## 2020-08-01 LAB — LIPID PANEL
Chol/HDL Ratio: 3.2 ratio (ref 0.0–5.0)
Cholesterol, Total: 134 mg/dL (ref 100–199)
HDL: 42 mg/dL (ref >39)
LDL Chol Calc (NIH): 75 mg/dL (ref 0–99)
Triglycerides: 90 mg/dL (ref 0–149)
VLDL Cholesterol Cal: 17 mg/dL (ref 5–40)

## 2020-08-01 LAB — PSA, TOTAL AND FREE
PSA, Free Pct: 18.9 %
PSA, Free: 0.72 ng/mL
Prostate Specific Ag, Serum: 3.8 ng/mL (ref 0.0–4.0)

## 2020-08-14 ENCOUNTER — Other Ambulatory Visit: Payer: Self-pay | Admitting: Family Medicine

## 2020-08-14 DIAGNOSIS — R350 Frequency of micturition: Secondary | ICD-10-CM

## 2020-08-23 ENCOUNTER — Other Ambulatory Visit: Payer: Self-pay | Admitting: Family Medicine

## 2020-08-23 DIAGNOSIS — E785 Hyperlipidemia, unspecified: Secondary | ICD-10-CM

## 2020-08-25 ENCOUNTER — Other Ambulatory Visit: Payer: Self-pay | Admitting: Family Medicine

## 2020-08-25 DIAGNOSIS — E785 Hyperlipidemia, unspecified: Secondary | ICD-10-CM

## 2020-10-03 IMAGING — CT CT RENAL STONE PROTOCOL
2 of 4 series · 16 of 46 positions shown, 18 images · non-contrast
Comparison: 10/02/2014

CLINICAL DATA: RIGHT flank pain since 4344 hours, hematuria,
question kidney stone

EXAM:
CT ABDOMEN AND PELVIS WITHOUT CONTRAST
TECHNIQUE: Multidetector CT imaging of the abdomen and pelvis was performed
following the standard protocol without IV contrast. Sagittal and
coronal MPR images reconstructed from axial data set. Oral contrast
not administered for this indication.

[Series 2: axial st · axial · 0.92mm/px · z∈[+874,+1309]mm · 13 of 99 slices shown, 15 images]
[im 6/99  soft-tissue]
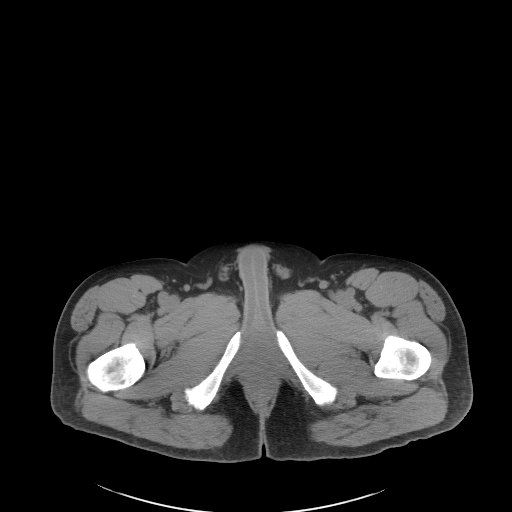
[im 6/99  bone]
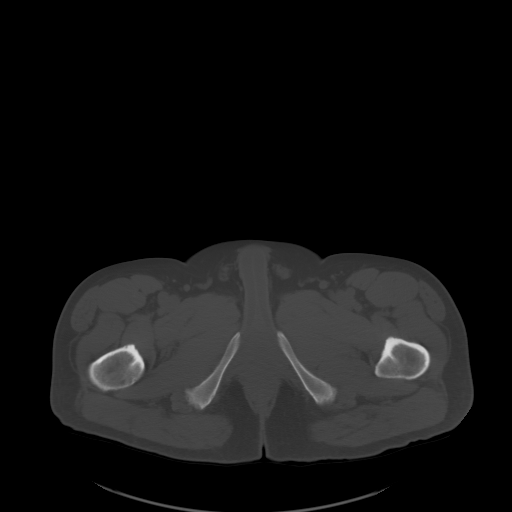
[im 11/99  soft-tissue]
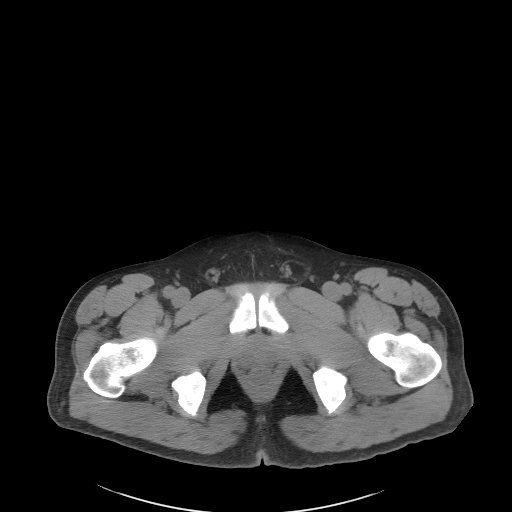
[im 22/99  soft-tissue]
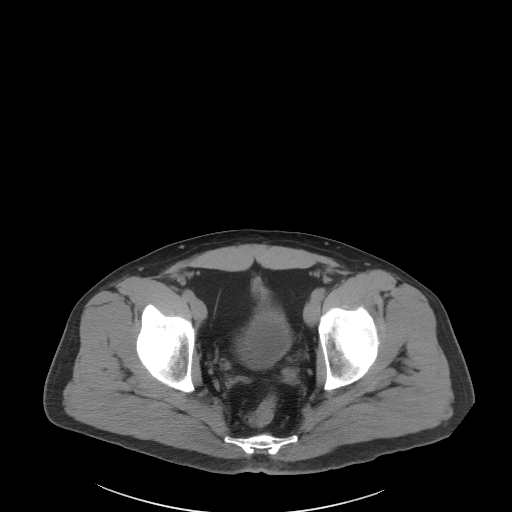
[im 28/99  soft-tissue]
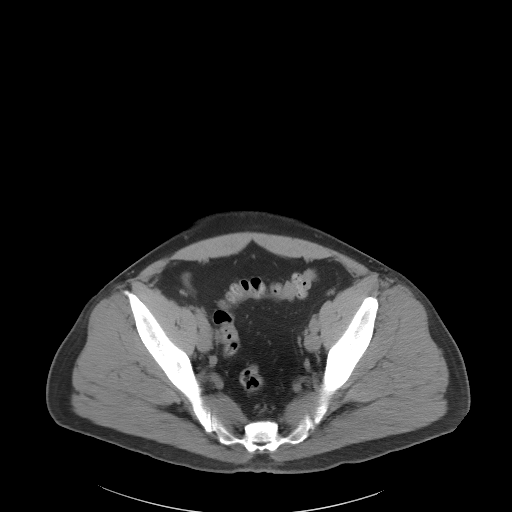
[im 33/99  soft-tissue]
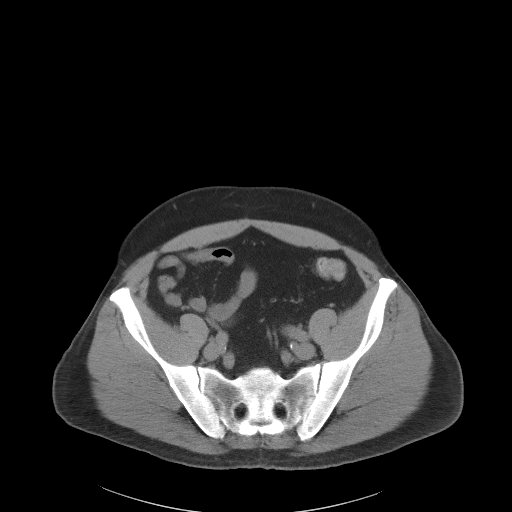
[im 44/99  soft-tissue]
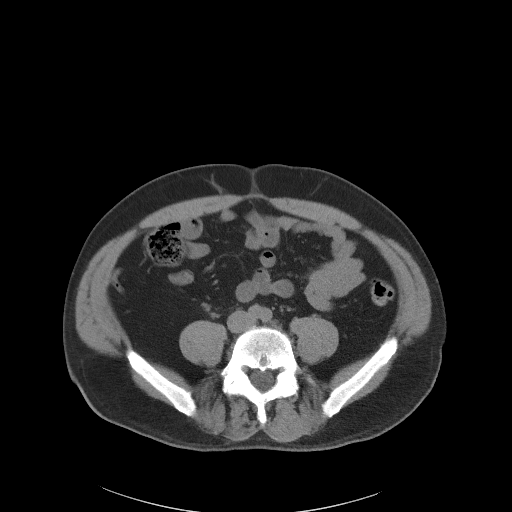
[im 50/99  soft-tissue]
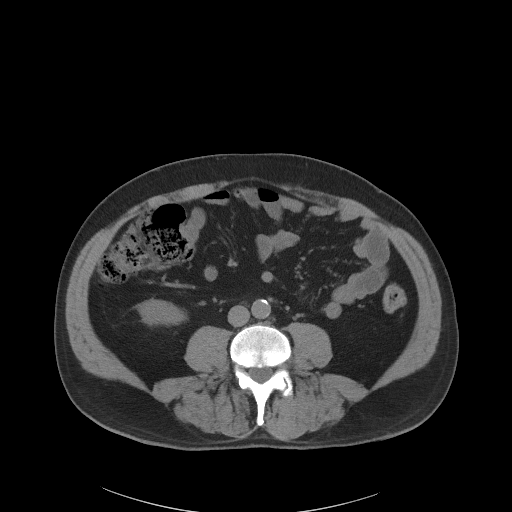
[im 55/99  soft-tissue]
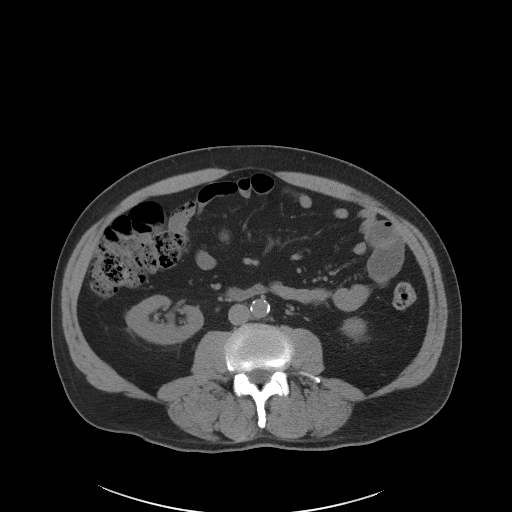
[im 66/99  soft-tissue]
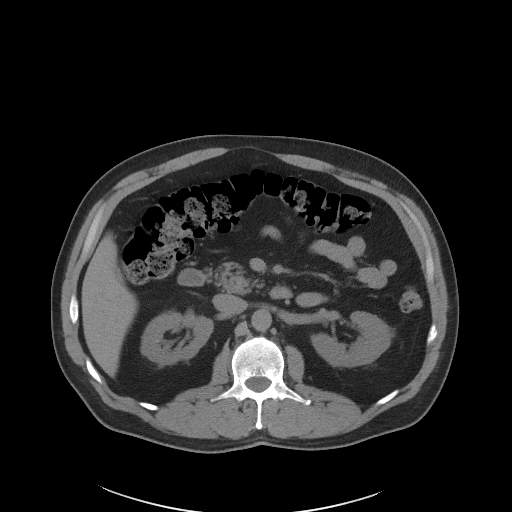
[im 66/99  bone]
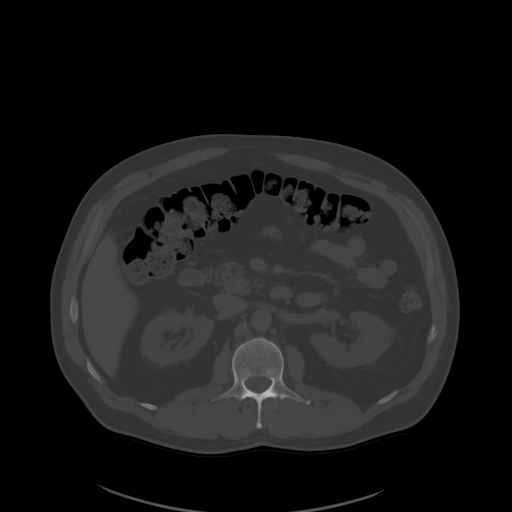
[im 71/99  soft-tissue]
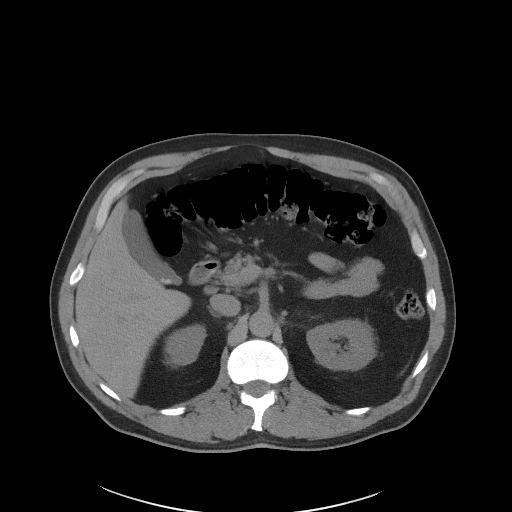
[im 77/99  soft-tissue]
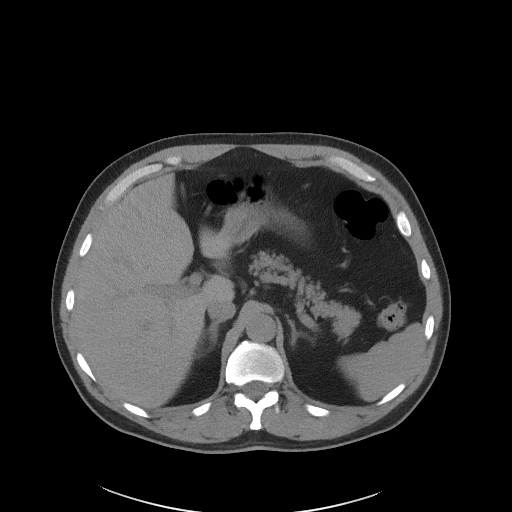
[im 88/99  soft-tissue]
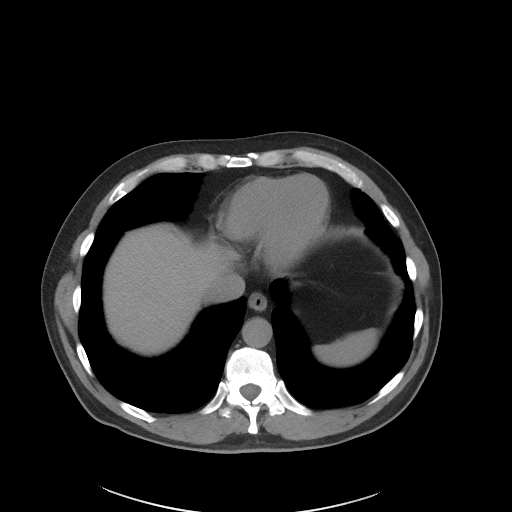
[im 93/99  soft-tissue]
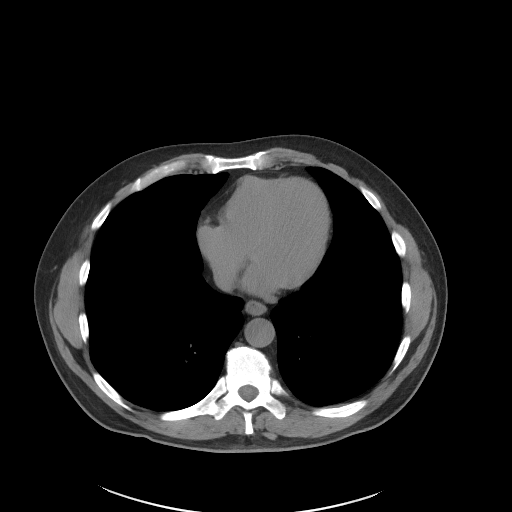

[Series 5: coronal st · coronal · 0.83mm/px · 3 of 108 slices shown]
[im 36/108  soft-tissue]
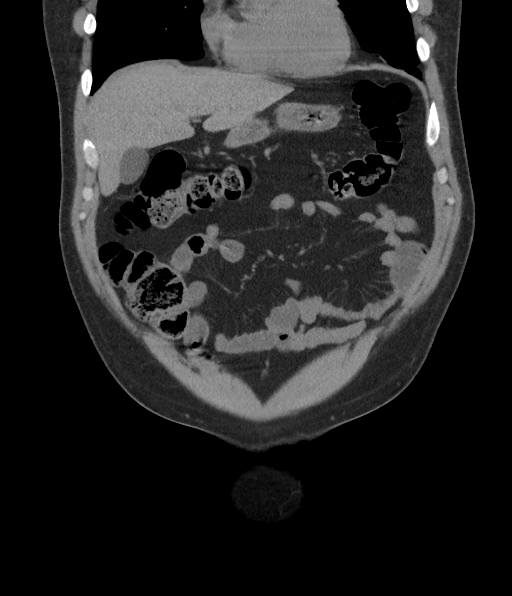
[im 48/108  soft-tissue]
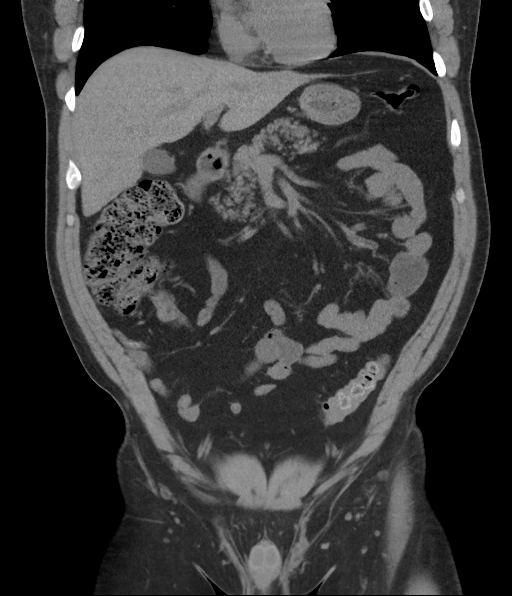
[im 60/108  soft-tissue]
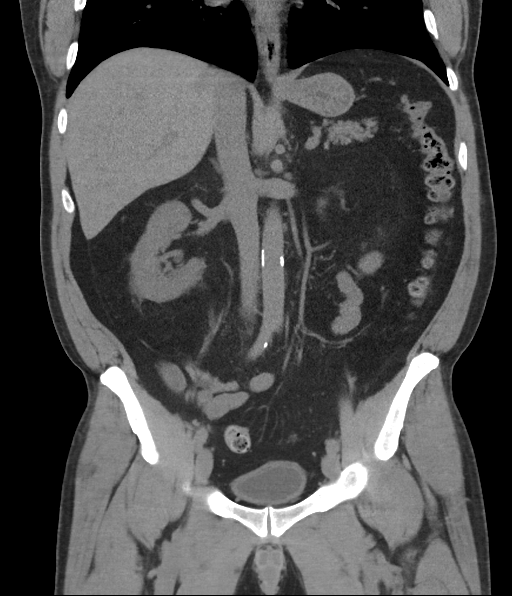

[16 of 46 positions shown; findings below may reference images not displayed]

FINDINGS: Lower chest: 3 mm RIGHT lower lobe nodule image 14 not definitely
seen on prior exam. Tiny calcified granuloma RIGHT lower lobe image
21. Additional potential calcified granuloma RIGHT middle lobe image
17. Noncalcified 3 mm subpleural nodule image 29 unchanged. 5 mm
subpleural nodule RIGHT lower lobe image 42.

Hepatobiliary: Dependent small gallstones within gallbladder.
Gallbladder and liver otherwise normal appearance.

Pancreas: Normal appearance

Spleen: Calcified granulomata within spleen.  No splenic mass.

Adrenals/Urinary Tract: Adrenal glands normal appearance. Kidneys
and ureters normal appearance. Minimal bladder wall thickening,
could reflect chronic outlet obstruction.

Stomach/Bowel: Normal appendix. Stomach and bowel loops normal
appearance.

Vascular/Lymphatic: Atherosclerotic calcification aorta and iliac
arteries without aneurysm. No adenopathy.

Reproductive: Prostatic enlargement, gland measuring 5.7 x 4.2 cm
image 85.

Other: Small LEFT inguinal hernia containing fat. No free air or
free fluid. Tiny umbilical hernia containing fat. No inflammatory
process.

Musculoskeletal: Degenerative disc disease changes L5-S1.
IMPRESSION: Cholelithiasis.

Small LEFT inguinal hernia containing fat.

Prostatic enlargement with minimal bladder wall thickening question
chronic outlet obstruction.

Urinary tract calcification or dilatation.

Tiny nonspecific nodules of lung bases greater on RIGHT, with an
additional calcified granuloma at RIGHT lung base, largest nodule 5
mm diameter, recommendation below.

No follow-up needed if patient is low-risk (and has no known or
suspected primary neoplasm). Non-contrast chest CT can be considered
in 12 months if patient is high-risk. This recommendation follows
the consensus statement: Guidelines for Management of Incidental
Pulmonary Nodules Detected on CT Images: From the [HOSPITAL]

## 2020-11-07 ENCOUNTER — Other Ambulatory Visit: Payer: Self-pay | Admitting: Family Medicine

## 2020-11-07 DIAGNOSIS — N401 Enlarged prostate with lower urinary tract symptoms: Secondary | ICD-10-CM

## 2020-12-02 ENCOUNTER — Other Ambulatory Visit: Payer: Self-pay | Admitting: Family Medicine

## 2020-12-02 DIAGNOSIS — E785 Hyperlipidemia, unspecified: Secondary | ICD-10-CM

## 2020-12-02 DIAGNOSIS — R350 Frequency of micturition: Secondary | ICD-10-CM

## 2020-12-04 ENCOUNTER — Telehealth: Payer: Self-pay

## 2020-12-04 MED ORDER — OMEPRAZOLE 20 MG PO CPDR: 20.0000 mg | DELAYED_RELEASE_CAPSULE | Freq: Two times a day (BID) | ORAL | 0 refills | Status: DC

## 2020-12-04 NOTE — Telephone Encounter (Signed)
  Prescription Request  12/04/2020  What is the name of the medication or equipment? omzprazole   Have you contacted your pharmacy to request a refill? (if applicable) yes  Which pharmacy would you like this sent to? [facility]   Patient notified that their request is being sent to the clinical staff for review and that they should receive a response within 2 business days.

## 2021-02-25 ENCOUNTER — Other Ambulatory Visit: Payer: Self-pay | Admitting: Family Medicine

## 2021-02-27 ENCOUNTER — Other Ambulatory Visit: Payer: Self-pay | Admitting: Family Medicine

## 2021-04-12 ENCOUNTER — Other Ambulatory Visit: Payer: Self-pay | Admitting: Family Medicine

## 2021-04-12 DIAGNOSIS — R350 Frequency of micturition: Secondary | ICD-10-CM

## 2021-04-12 DIAGNOSIS — N401 Enlarged prostate with lower urinary tract symptoms: Secondary | ICD-10-CM

## 2021-05-01 ENCOUNTER — Other Ambulatory Visit: Payer: Self-pay | Admitting: Family Medicine

## 2021-05-01 DIAGNOSIS — E785 Hyperlipidemia, unspecified: Secondary | ICD-10-CM

## 2021-05-12 ENCOUNTER — Other Ambulatory Visit: Payer: Self-pay | Admitting: Family Medicine

## 2021-05-12 DIAGNOSIS — N401 Enlarged prostate with lower urinary tract symptoms: Secondary | ICD-10-CM

## 2021-05-13 NOTE — Telephone Encounter (Signed)
Stacks. NTBS 30 days given 04/12/21

## 2021-05-29 ENCOUNTER — Other Ambulatory Visit: Payer: Self-pay | Admitting: Family Medicine

## 2021-05-29 DIAGNOSIS — E785 Hyperlipidemia, unspecified: Secondary | ICD-10-CM

## 2021-05-29 NOTE — Telephone Encounter (Signed)
LMTCB

## 2021-05-29 NOTE — Telephone Encounter (Signed)
Stacks. NTBS 30 days given 05/01/21

## 2021-06-06 ENCOUNTER — Ambulatory Visit (INDEPENDENT_AMBULATORY_CARE_PROVIDER_SITE_OTHER): Payer: 59 | Admitting: Family Medicine

## 2021-06-06 ENCOUNTER — Encounter: Payer: Self-pay | Admitting: Family Medicine

## 2021-06-06 ENCOUNTER — Other Ambulatory Visit: Payer: Self-pay

## 2021-06-06 VITALS — BP 121/82 | HR 70 | Temp 97.9°F | Ht 71.0 in | Wt 200.4 lb

## 2021-06-06 DIAGNOSIS — M26609 Unspecified temporomandibular joint disorder, unspecified side: Secondary | ICD-10-CM

## 2021-06-06 DIAGNOSIS — W57XXXA Bitten or stung by nonvenomous insect and other nonvenomous arthropods, initial encounter: Secondary | ICD-10-CM | POA: Diagnosis not present

## 2021-06-06 DIAGNOSIS — S30861A Insect bite (nonvenomous) of abdominal wall, initial encounter: Secondary | ICD-10-CM

## 2021-06-06 MED ORDER — METHOCARBAMOL 500 MG PO TABS: 500.0000 mg | ORAL_TABLET | Freq: Three times a day (TID) | ORAL | 0 refills | Status: DC | PRN

## 2021-06-06 MED ORDER — DOXYCYCLINE HYCLATE 100 MG PO TABS: 100.0000 mg | ORAL_TABLET | Freq: Two times a day (BID) | ORAL | 0 refills | Status: AC

## 2021-06-06 NOTE — Progress Notes (Signed)
Assessment & Plan:  1. Tick bite of abdomen, initial encounter Doxycycline extended another week due to symptom of headache and only being treated so far with 7 days. Discussed rash is not a rash we see we tick bites. Offered steroid cream but he does not feel he needs it since the rash is improving and no longer itches. Education provided on Lyme Disease. - doxycycline (VIBRA-TABS) 100 MG tablet; Take 1 tablet (100 mg total) by mouth 2 (two) times daily for 7 days.  Dispense: 14 tablet; Refill: 0  2. TMJ (temporomandibular joint disorder) Encouraged to keep follow-up with dentist. Decrease gum chewing. NSAID and Robaxin for pain relief.  - methocarbamol (ROBAXIN) 500 MG tablet; Take 1 tablet (500 mg total) by mouth every 8 (eight) hours as needed for muscle spasms.  Dispense: 60 tablet; Refill: 0   Follow up plan: Return if symptoms worsen or fail to improve.  [name], MSN, APRN, FNP-C [facility]  Subjective:   Patient ID: Earl Martinez, male    DOB: June 02, 1961, 60 y.o.   MRN: [id number]  HPI: Earl Martinez is a 60 y.o. male presenting on 06/06/2021 for Tick Removal (Patient states he had two tick removed then got a rash on bilateral legs.  Went to [facility] and they gave him doxy.  Rash better but still there)  Patient removed two deer ticks recently. He developed an itchy rash on both legs three days prior to going to urgent care where he was treated with Doxycycline x7 days. He completed the antibiotics yesterday. He states the rash is getting better, but is not gone yet. Denies fever, increased joint pain, nausea, or vomiting. He has been having headaches frequently over the past week.   Additionally he reports the right side of his jaw has been hurting and radiating to his right ear. He does chew a lot of gum. He does occasionally take Aleve at bedtime but still wakes during the night with the pain. He has scheduled an upcoming appointment with his  dentist.    ROS: Negative unless specifically indicated above in HPI.   Relevant past medical history reviewed and updated as indicated.   Allergies and medications reviewed and updated.   Current Outpatient Medications:    atorvastatin (LIPITOR) 40 MG tablet, Take 1 tablet (40 mg total) by mouth daily. (NEEDS TO BE SEEN BEFORE NEXT REFILL), Disp: 30 tablet, Rfl: 0   cetirizine (ZYRTEC) 10 MG tablet, Take 1 tablet (10 mg total) by mouth daily., Disp: 30 tablet, Rfl: 11   docusate sodium (COLACE) 100 MG capsule, Take 100 mg by mouth daily as needed for mild constipation., Disp: , Rfl:    fish oil-omega-3 fatty acids 1000 MG capsule, Take 2 g by mouth daily., Disp: , Rfl:    fluticasone (FLONASE) 50 MCG/ACT nasal spray, SPRAY 2 SPRAYS INTO EACH NOSTRIL EVERY DAY, Disp: 48 mL, Rfl: 1   Multiple Vitamin (MULTIVITAMIN) tablet, Take 1 tablet by mouth daily., Disp: , Rfl:    omeprazole (PRILOSEC) 20 MG capsule, Take 1 capsule (20 mg total) by mouth 2 (two) times daily. (NEEDS TO BE SEEN BEFORE NEXT REFILL), Disp: 60 capsule, Rfl: 0   tamsulosin (FLOMAX) 0.4 MG CAPS capsule, Take 1 capsule (0.4 mg total) by mouth daily. (NEEDS TO BE SEEN BEFORE NEXT REFILL), Disp: 30 capsule, Rfl: 0  No Known Allergies  Objective:   BP 121/82   Pulse 70   Temp 97.9 F (36.6 C) (Temporal)  Ht 5\' 11"  (1.803 m)   Wt 200 lb 6.4 oz (90.9 kg)   SpO2 95%   BMI 27.95 kg/m    Physical Exam Vitals reviewed.  Constitutional:      General: He is not in acute distress.    Appearance: Normal appearance. He is not ill-appearing, toxic-appearing or diaphoretic.  HENT:     Head: Normocephalic and atraumatic.     Right Ear: Tympanic membrane, ear canal and external ear normal. There is no impacted cerumen.     Left Ear: Tympanic membrane, ear canal and external ear normal. There is no impacted cerumen.     Mouth/Throat:     Comments: TMJ tenderness on right side. Eyes:     General: No scleral icterus.        Right eye: No discharge.        Left eye: No discharge.     Conjunctiva/sclera: Conjunctivae normal.  Cardiovascular:     Rate and Rhythm: Normal rate.  Pulmonary:     Effort: Pulmonary effort is normal. No respiratory distress.  Musculoskeletal:        General: Normal range of motion.     Cervical back: Normal range of motion.  Skin:    General: Skin is warm and dry.     Comments: Tick bite of left side of abdomen cannot be detected on exam. Tick bite of the back of his right thigh has no surrounding erythema, warmth, swelling, rash, or drainage.  Neurological:     Mental Status: He is alert and oriented to person, place, and time. Mental status is at baseline.  Psychiatric:        Mood and Affect: Mood normal.        Behavior: Behavior normal.        Thought Content: Thought content normal.        Judgment: Judgment normal.

## 2021-06-09 ENCOUNTER — Encounter: Payer: Self-pay | Admitting: Family Medicine

## 2021-07-02 ENCOUNTER — Ambulatory Visit (INDEPENDENT_AMBULATORY_CARE_PROVIDER_SITE_OTHER): Payer: 59 | Admitting: Family Medicine

## 2021-07-02 ENCOUNTER — Encounter: Payer: Self-pay | Admitting: Family Medicine

## 2021-07-02 DIAGNOSIS — U071 COVID-19: Secondary | ICD-10-CM | POA: Diagnosis not present

## 2021-07-02 DIAGNOSIS — R350 Frequency of micturition: Secondary | ICD-10-CM

## 2021-07-02 DIAGNOSIS — N401 Enlarged prostate with lower urinary tract symptoms: Secondary | ICD-10-CM

## 2021-07-02 MED ORDER — TAMSULOSIN HCL 0.4 MG PO CAPS: 0.4000 mg | ORAL_CAPSULE | Freq: Every day | ORAL | 0 refills | Status: DC

## 2021-07-02 MED ORDER — MOLNUPIRAVIR EUA 200MG CAPSULE: 4.0000 | ORAL_CAPSULE | Freq: Two times a day (BID) | ORAL | 0 refills | Status: AC

## 2021-07-02 NOTE — Progress Notes (Signed)
Subjective:    Patient ID: Earl Martinez, male    DOB: 12-28-1960, 60 y.o.   MRN: [id number]   HPI: Earl Martinez is a 60 y.o. male presenting for positive CoVID test X 2 at home. Onset ysterday morning. Difficulty breathing in the night the night before last. Decreased appetite. Fatigued. Had CoVID last year. Fever to 99.2 this AM. Relief with tylenol. HA, mild at temples. A little bit of cough. Posterior sinus drainage. Appetite better this morning.    Depression screen [facility] 2/9 06/06/2021 07/31/2020 04/11/2020 01/16/2020 12/27/2018  Decreased Interest 0 0 0 0 0  Down, Depressed, Hopeless 0 0 0 0 0  PHQ - 2 Score 0 0 0 0 0  Altered sleeping 1 - 0 - -  Tired, decreased energy 1 - 0 - -  Change in appetite 1 - 0 - -  Feeling bad or failure about yourself  1 - 0 - -  Trouble concentrating 0 - 0 - -  Moving slowly or fidgety/restless 0 - 0 - -  Suicidal thoughts 0 - 0 - -  PHQ-9 Score 4 - 0 - -  Difficult doing work/chores Somewhat difficult - - - -     Relevant past medical, surgical, family and social history reviewed and updated as indicated.  Interim medical history since our last visit reviewed. Allergies and medications reviewed and updated.  ROS:  Review of Systems  Constitutional:  Negative for activity change, appetite change, chills and fever.  HENT:  Positive for congestion, postnasal drip and rhinorrhea. Negative for ear discharge, ear pain, hearing loss, nosebleeds, sneezing and trouble swallowing.   Respiratory:  Positive for cough. Negative for chest tightness and shortness of breath.   Cardiovascular:  Negative for chest pain and palpitations.  Genitourinary:  Positive for difficulty urinating and frequency.  Skin:  Negative for rash.    Social History   Tobacco Use  Smoking Status Never  Smokeless Tobacco Never       Objective:     Wt Readings from Last 3 Encounters:  06/06/21 200 lb 6.4 oz (90.9 kg)  07/31/20 199 lb (90.3 kg)  04/11/20 204 lb (92.5  kg)     Exam deferred. Pt. Harboring due to COVID 19. Phone visit performed.   Assessment & Plan:   1. COVID-19 virus infection   2. Benign prostatic hyperplasia with urinary frequency     Meds ordered this encounter  Medications   molnupiravir EUA 200 mg CAPS    Sig: Take 4 capsules (800 mg total) by mouth 2 (two) times daily for 5 days.    Dispense:  40 capsule    Refill:  0   tamsulosin (FLOMAX) 0.4 MG CAPS capsule    Sig: Take 1 capsule (0.4 mg total) by mouth daily. (NEEDS TO BE SEEN BEFORE NEXT REFILL)    Dispense:  30 capsule    Refill:  0    Orders Placed This Encounter  Procedures   Novel Coronavirus, NAA ([facility])    Order Specific Question:   Is this test for diagnosis or screening    Answer:   Diagnosis of ill patient    Order Specific Question:   Symptomatic for COVID-19 as defined by CDC    Answer:   Yes    Order Specific Question:   Date of Symptom Onset    Answer:   07/01/2021    Order Specific Question:   Hospitalized for COVID-19    Answer:  No    Order Specific Question:   Admitted to ICU for COVID-19    Answer:   No    Order Specific Question:   Previously tested for COVID-19    Answer:   Yes    Order Specific Question:   Resident in a congregate (group) care setting    Answer:   No    Order Specific Question:   Is the patient student?    Answer:   No    Order Specific Question:   Employed in healthcare setting    Answer:   No    Order Specific Question:   Has patient completed COVID vaccination(s) (2 doses of Pfizer/Moderna 1 dose of [employer])    Answer:   No    Order Specific Question:   Release to patient    Answer:   Immediate      Diagnoses and all orders for this visit:  COVID-19 virus infection -     molnupiravir EUA 200 mg CAPS; Take 4 capsules (800 mg total) by mouth 2 (two) times daily for 5 days. -     Novel Coronavirus, NAA ([facility])  Benign prostatic hyperplasia with urinary frequency -     tamsulosin (FLOMAX) 0.4 MG  CAPS capsule; Take 1 capsule (0.4 mg total) by mouth daily. (NEEDS TO BE SEEN BEFORE NEXT REFILL)   Virtual Visit via telephone Note  I discussed the limitations, risks, security and privacy concerns of performing an evaluation and management service by telephone and the availability of in person appointments. The patient was identified with two identifiers. Pt.expressed understanding and agreed to proceed. Pt. Is at home. Dr. Livia Snellen is in his office.  Follow Up Instructions:   I discussed the assessment and treatment plan with the patient. The patient was provided an opportunity to ask questions and all were answered. The patient agreed with the plan and demonstrated an understanding of the instructions.   The patient was advised to call back or seek an in-person evaluation if the symptoms worsen or if the condition fails to improve as anticipated.   Total minutes including chart review and phone contact time: 18   Follow up plan: Return in about 1 month (around 08/02/2021).  Claretta Fraise, MD [place]

## 2021-07-04 ENCOUNTER — Telehealth: Payer: Self-pay | Admitting: Family Medicine

## 2021-07-04 ENCOUNTER — Encounter: Payer: Self-pay | Admitting: *Deleted

## 2021-07-04 LAB — NOVEL CORONAVIRUS, NAA: SARS-CoV-2, NAA: DETECTED — AB

## 2021-07-04 LAB — SARS-COV-2, NAA 2 DAY TAT

## 2021-07-04 NOTE — Telephone Encounter (Signed)
Done and available in MyChart, advised patient via MyChart message

## 2021-07-04 NOTE — Telephone Encounter (Signed)
Okay for note

## 2021-07-04 NOTE — Telephone Encounter (Signed)
Yes, please.

## 2021-07-04 NOTE — Telephone Encounter (Signed)
Pt needs a note written to return to work on Tuesday 8/2

## 2021-07-18 ENCOUNTER — Encounter: Payer: Self-pay | Admitting: Family Medicine

## 2021-07-18 ENCOUNTER — Other Ambulatory Visit: Payer: Self-pay

## 2021-07-18 ENCOUNTER — Ambulatory Visit (INDEPENDENT_AMBULATORY_CARE_PROVIDER_SITE_OTHER): Payer: 59 | Admitting: Family Medicine

## 2021-07-18 VITALS — BP 129/73 | HR 59 | Temp 97.5°F | Ht 71.0 in | Wt 201.8 lb

## 2021-07-18 DIAGNOSIS — E785 Hyperlipidemia, unspecified: Secondary | ICD-10-CM | POA: Diagnosis not present

## 2021-07-18 DIAGNOSIS — K2100 Gastro-esophageal reflux disease with esophagitis, without bleeding: Secondary | ICD-10-CM | POA: Diagnosis not present

## 2021-07-18 DIAGNOSIS — R350 Frequency of micturition: Secondary | ICD-10-CM | POA: Diagnosis not present

## 2021-07-18 DIAGNOSIS — N401 Enlarged prostate with lower urinary tract symptoms: Secondary | ICD-10-CM | POA: Diagnosis not present

## 2021-07-18 MED ORDER — ATORVASTATIN CALCIUM 40 MG PO TABS: 40.0000 mg | ORAL_TABLET | Freq: Every day | ORAL | 3 refills | Status: AC

## 2021-07-18 MED ORDER — OMEPRAZOLE 20 MG PO CPDR: 20.0000 mg | DELAYED_RELEASE_CAPSULE | Freq: Two times a day (BID) | ORAL | 3 refills | Status: DC

## 2021-07-18 MED ORDER — TAMSULOSIN HCL 0.4 MG PO CAPS: 0.4000 mg | ORAL_CAPSULE | Freq: Every day | ORAL | 3 refills | Status: AC

## 2021-07-18 NOTE — Progress Notes (Signed)
Subjective:.  Patient ID: Earl Martinez, male    DOB: 1961-03-19  Age: 60 y.o. MRN: [id number]  CC: Medical Management of Chronic Issues   HPI Earl Martinez presents for  in for follow-up of elevated cholesterol. Doing well without complaints on current medication. Denies side effects of statin including myalgia and arthralgia and nausea. Currently no chest pain, shortness of breath or other cardiovascular related symptoms noted. Patient in for follow-up of GERD. Currently asymptomatic taking  PPI prn. There is 3-4 x/week heartburn. No hematemesis and no melena. No dysphagia or choking. Onset is remote. Progression is stable. Complicating factors, none. BPH - up once at night. Taking Tamsulosin daily. Taking in AM. Has noticed a little light headedness  Depression screen [facility] 2/9 07/18/2021 06/06/2021 07/31/2020  Decreased Interest 0 0 0  Down, Depressed, Hopeless 0 0 0  PHQ - 2 Score 0 0 0  Altered sleeping - 1 -  Tired, decreased energy - 1 -  Change in appetite - 1 -  Feeling bad or failure about yourself  - 1 -  Trouble concentrating - 0 -  Moving slowly or fidgety/restless - 0 -  Suicidal thoughts - 0 -  PHQ-9 Score - 4 -  Difficult doing work/chores - Somewhat difficult -    History Earl Martinez has a past medical history of Hyperlipemia, Neck pain, Rectal abscess, and Seasonal allergies.   Earl Martinez has a past surgical history that includes Rectal surgery (09/19/11); Neck surgery; Nasal septum surgery; Back surgery; and Rotator cuff repair (Right, 02/19/2017).   His family history is not on file.Earl Martinez reports that Earl Martinez has never smoked. Earl Martinez has never used smokeless tobacco. Earl Martinez reports that Earl Martinez does not drink alcohol and does not use drugs.    ROS Review of Systems  Constitutional:  Negative for fever.  Respiratory:  Negative for shortness of breath.   Cardiovascular:  Negative for chest pain.  Gastrointestinal:  Positive for abdominal pain (frequent heartburn).  Musculoskeletal:  Negative  for arthralgias.  Skin:  Negative for rash.  Neurological:  Positive for light-headedness (occasiional).   Objective:  BP 129/73   Pulse (!) 59   Temp (!) 97.5 F (36.4 C)   Ht _0  (1.803 m)   Wt 201 lb 12.8 oz (91.5 kg)   SpO2 97%   BMI 28.15 kg/m   BP Readings from Last 3 Encounters:  07/18/21 129/73  06/06/21 121/82  07/31/20 125/81    Wt Readings from Last 3 Encounters:  07/18/21 201 lb 12.8 oz (91.5 kg)  06/06/21 200 lb 6.4 oz (90.9 kg)  07/31/20 199 lb (90.3 kg)     Physical Exam Constitutional:      General: Earl Martinez is not in acute distress.    Appearance: Earl Martinez is well-developed.  HENT:     Head: Normocephalic and atraumatic.     Right Ear: External ear normal.     Left Ear: External ear normal.     Nose: Nose normal.  Eyes:     Conjunctiva/sclera: Conjunctivae normal.     Pupils: Pupils are equal, round, and reactive to light.  Cardiovascular:     Rate and Rhythm: Normal rate and regular rhythm.     Heart sounds: Normal heart sounds. No murmur heard. Pulmonary:     Effort: Pulmonary effort is normal. No respiratory distress.     Breath sounds: Normal breath sounds. No wheezing or rales.  Abdominal:     Palpations: Abdomen is soft.     Tenderness: There  is no abdominal tenderness.  Musculoskeletal:        General: Normal range of motion.     Cervical back: Normal range of motion and neck supple.  Skin:    General: Skin is warm and dry.  Neurological:     Mental Status: Earl Martinez is alert and oriented to person, place, and time.     Deep Tendon Reflexes: Reflexes are normal and symmetric.  Psychiatric:        Behavior: Behavior normal.        Thought Content: Thought content normal.        Judgment: Judgment normal.      Assessment & Plan:   Earl Martinez was seen today for medical management of chronic issues.  Diagnoses and all orders for this visit:  Gastroesophageal reflux disease with esophagitis without hemorrhage -     CBC with  Differential/Platelet -     CMP14+EGFR -     Lipid panel  Hyperlipidemia, unspecified hyperlipidemia type -     atorvastatin (LIPITOR) 40 MG tablet; Take 1 tablet (40 mg total) by mouth daily. -     CBC with Differential/Platelet -     CMP14+EGFR -     Lipid panel  Benign prostatic hyperplasia with urinary frequency -     tamsulosin (FLOMAX) 0.4 MG CAPS capsule; Take 1 capsule (0.4 mg total) by mouth daily. -     CBC with Differential/Platelet -     CMP14+EGFR -     Lipid panel  Other orders -     omeprazole (PRILOSEC) 20 MG capsule; Take 1 capsule (20 mg total) by mouth 2 (two) times daily.      I have changed Earl Martinez's atorvastatin, omeprazole, and tamsulosin. I am also having him maintain his fish oil-omega-3 fatty acids, multivitamin, docusate sodium, cetirizine, fluticasone, and methocarbamol.  Allergies as of 07/18/2021   No Known Allergies      Medication List        Accurate as of July 18, 2021  1:41 PM. If you have any questions, ask your nurse or doctor.          atorvastatin 40 MG tablet Commonly known as: LIPITOR Take 1 tablet (40 mg total) by mouth daily. What changed: additional instructions Changed by: Claretta Fraise, MD   cetirizine 10 MG tablet Commonly known as: ZYRTEC Take 1 tablet (10 mg total) by mouth daily.   docusate sodium 100 MG capsule Commonly known as: COLACE Take 100 mg by mouth daily as needed for mild constipation.   fish oil-omega-3 fatty acids 1000 MG capsule Take 2 g by mouth daily.   fluticasone 50 MCG/ACT nasal spray Commonly known as: FLONASE SPRAY 2 SPRAYS INTO EACH NOSTRIL EVERY DAY   methocarbamol 500 MG tablet Commonly known as: Robaxin Take 1 tablet (500 mg total) by mouth every 8 (eight) hours as needed for muscle spasms.   multivitamin tablet Take 1 tablet by mouth daily.   omeprazole 20 MG capsule Commonly known as: PRILOSEC Take 1 capsule (20 mg total) by mouth 2 (two) times daily. What  changed: additional instructions Changed by: Claretta Fraise, MD   tamsulosin 0.4 MG Caps capsule Commonly known as: FLOMAX Take 1 capsule (0.4 mg total) by mouth daily. What changed: additional instructions Changed by: Claretta Fraise, MD       Increase the omeprazole to daily for PUD prevention. Take Tamsulosin at bedtime to avoid lightheadedness  Follow-up: Return in about 6 months (around 01/18/2022) for [facility] physical.  Klein Willcox, M.D.  

## 2021-07-19 LAB — LIPID PANEL
Chol/HDL Ratio: 3.2 ratio (ref 0.0–5.0)
Cholesterol, Total: 140 mg/dL (ref 100–199)
HDL: 44 mg/dL (ref >39)
LDL Chol Calc (NIH): 83 mg/dL (ref 0–99)
Triglycerides: 65 mg/dL (ref 0–149)
VLDL Cholesterol Cal: 13 mg/dL (ref 5–40)

## 2021-07-19 LAB — CBC WITH DIFFERENTIAL/PLATELET
Basophils Absolute: 0 10*3/uL (ref 0.0–0.2)
Basos: 1 %
EOS (ABSOLUTE): 0.3 10*3/uL (ref 0.0–0.4)
Eos: 4 %
Hematocrit: 38.3 % (ref 37.5–51.0)
Hemoglobin: 13.2 g/dL (ref 13.0–17.7)
Immature Grans (Abs): 0 10*3/uL (ref 0.0–0.1)
Immature Granulocytes: 0 %
Lymphocytes Absolute: 1.8 10*3/uL (ref 0.7–3.1)
Lymphs: 28 %
MCH: 29.7 pg (ref 26.6–33.0)
MCHC: 34.5 g/dL (ref 31.5–35.7)
MCV: 86 fL (ref 79–97)
Monocytes Absolute: 0.5 10*3/uL (ref 0.1–0.9)
Monocytes: 8 %
Neutrophils Absolute: 3.8 10*3/uL (ref 1.4–7.0)
Neutrophils: 59 %
Platelets: 165 10*3/uL (ref 150–450)
RBC: 4.45 x10E6/uL (ref 4.14–5.80)
RDW: 12.6 % (ref 11.6–15.4)
WBC: 6.4 10*3/uL (ref 3.4–10.8)

## 2021-07-19 LAB — CMP14+EGFR
ALT: 19 IU/L (ref 0–44)
AST: 20 IU/L (ref 0–40)
Albumin/Globulin Ratio: 1.8 (ref 1.2–2.2)
Albumin: 4.3 g/dL (ref 3.8–4.9)
Alkaline Phosphatase: 77 IU/L (ref 44–121)
BUN/Creatinine Ratio: 21 — ABNORMAL HIGH (ref 9–20)
BUN: 19 mg/dL (ref 6–24)
Bilirubin Total: 0.2 mg/dL (ref 0.0–1.2)
CO2: 23 mmol/L (ref 20–29)
Calcium: 9.1 mg/dL (ref 8.7–10.2)
Chloride: 107 mmol/L — ABNORMAL HIGH (ref 96–106)
Creatinine, Ser: 0.91 mg/dL (ref 0.76–1.27)
Globulin, Total: 2.4 g/dL (ref 1.5–4.5)
Glucose: 96 mg/dL (ref 65–99)
Potassium: 4.3 mmol/L (ref 3.5–5.2)
Sodium: 143 mmol/L (ref 134–144)
Total Protein: 6.7 g/dL (ref 6.0–8.5)
eGFR: 98 mL/min/{1.73_m2} (ref >59)

## 2021-07-22 NOTE — Progress Notes (Signed)
Hello Amontae,  Your lab result is normal and/or stable.Some minor variations that are not significant are commonly marked abnormal, but do not represent any medical problem for you.  Best regards, Claretta Fraise, M.D.

## 2021-07-24 ENCOUNTER — Other Ambulatory Visit: Payer: Self-pay | Admitting: Family Medicine

## 2021-07-24 DIAGNOSIS — N401 Enlarged prostate with lower urinary tract symptoms: Secondary | ICD-10-CM

## 2021-07-24 DIAGNOSIS — R350 Frequency of micturition: Secondary | ICD-10-CM

## 2021-08-13 ENCOUNTER — Encounter: Payer: Self-pay | Admitting: Family Medicine

## 2021-12-13 ENCOUNTER — Other Ambulatory Visit: Payer: Self-pay | Admitting: Family Medicine

## 2021-12-13 DIAGNOSIS — M26609 Unspecified temporomandibular joint disorder, unspecified side: Secondary | ICD-10-CM

## 2022-01-23 ENCOUNTER — Other Ambulatory Visit: Payer: Self-pay | Admitting: Family Medicine

## 2022-01-23 DIAGNOSIS — M26609 Unspecified temporomandibular joint disorder, unspecified side: Secondary | ICD-10-CM

## 2022-01-23 NOTE — Telephone Encounter (Signed)
Last office visit 07/18/21 Last refill 12/15/21, #60, no refills

## 2022-03-20 ENCOUNTER — Other Ambulatory Visit: Payer: Self-pay | Admitting: Nurse Practitioner

## 2022-03-20 DIAGNOSIS — M26609 Unspecified temporomandibular joint disorder, unspecified side: Secondary | ICD-10-CM

## 2022-05-17 ENCOUNTER — Other Ambulatory Visit: Payer: Self-pay | Admitting: Family Medicine

## 2022-05-17 DIAGNOSIS — E785 Hyperlipidemia, unspecified: Secondary | ICD-10-CM

## 2022-05-19 ENCOUNTER — Encounter (HOSPITAL_COMMUNITY): Payer: Self-pay

## 2022-05-19 ENCOUNTER — Emergency Department (HOSPITAL_COMMUNITY): Payer: 59

## 2022-05-19 ENCOUNTER — Other Ambulatory Visit: Payer: Self-pay

## 2022-05-19 ENCOUNTER — Emergency Department (HOSPITAL_COMMUNITY)
Admission: EM | Admit: 2022-05-19 | Discharge: 2022-05-19 | Disposition: A | Discharge status: Home / Self Care | Payer: 59 | Attending: Emergency Medicine | Admitting: Emergency Medicine

## 2022-05-19 DIAGNOSIS — Z79899 Other long term (current) drug therapy: Secondary | ICD-10-CM | POA: Diagnosis not present

## 2022-05-19 DIAGNOSIS — R0789 Other chest pain: Secondary | ICD-10-CM | POA: Insufficient documentation

## 2022-05-19 DIAGNOSIS — R1310 Dysphagia, unspecified: Secondary | ICD-10-CM | POA: Diagnosis not present

## 2022-05-19 DIAGNOSIS — R079 Chest pain, unspecified: Secondary | ICD-10-CM

## 2022-05-19 LAB — COMPREHENSIVE METABOLIC PANEL
ALT: 26 U/L (ref 0–44)
AST: 20 U/L (ref 15–41)
Albumin: 4 g/dL (ref 3.5–5.0)
Alkaline Phosphatase: 59 U/L (ref 38–126)
Anion gap: 3 — ABNORMAL LOW (ref 5–15)
BUN: 26 mg/dL — ABNORMAL HIGH (ref 6–20)
CO2: 28 mmol/L (ref 22–32)
Calcium: 9.1 mg/dL (ref 8.9–10.3)
Chloride: 107 mmol/L (ref 98–111)
Creatinine, Ser: 1.01 mg/dL (ref 0.61–1.24)
GFR, Estimated: >60 mL/min (ref >60)
Glucose, Bld: 107 mg/dL — ABNORMAL HIGH (ref 70–99)
Potassium: 3.5 mmol/L (ref 3.5–5.1)
Sodium: 138 mmol/L (ref 135–145)
Total Bilirubin: 0.7 mg/dL (ref 0.3–1.2)
Total Protein: 7.1 g/dL (ref 6.5–8.1)

## 2022-05-19 LAB — CBC
HCT: 42.3 % (ref 39.0–52.0)
Hemoglobin: 13.8 g/dL (ref 13.0–17.0)
MCH: 29.7 pg (ref 26.0–34.0)
MCHC: 32.6 g/dL (ref 30.0–36.0)
MCV: 91 fL (ref 80.0–100.0)
Platelets: 174 10*3/uL (ref 150–400)
RBC: 4.65 MIL/uL (ref 4.22–5.81)
RDW: 12.7 % (ref 11.5–15.5)
WBC: 8 10*3/uL (ref 4.0–10.5)
nRBC: 0 % (ref 0.0–0.2)

## 2022-05-19 LAB — TROPONIN I (HIGH SENSITIVITY)
Troponin I (High Sensitivity): 4 ng/L (ref <18)
Troponin I (High Sensitivity): 5 ng/L (ref <18)

## 2022-05-19 LAB — MAGNESIUM: Magnesium: 2.2 mg/dL (ref 1.7–2.4)

## 2022-05-19 MED ORDER — LACTATED RINGERS IV BOLUS
1000.0000 mL | Freq: Once | INTRAVENOUS | Status: AC
  Administered 2022-05-19: 1000 mL via INTRAVENOUS

## 2022-05-19 MED ORDER — DIAZEPAM 5 MG/ML IJ SOLN
2.5000 mg | Freq: Once | INTRAMUSCULAR | Status: AC
  Administered 2022-05-19: 2.5 mg via INTRAVENOUS
  Filled 2022-05-19: qty 2

## 2022-05-19 MED ORDER — LIDOCAINE VISCOUS HCL 2 % MT SOLN
15.0000 mL | Freq: Once | OROMUCOSAL | Status: AC
  Administered 2022-05-19: 15 mL via ORAL
  Filled 2022-05-19: qty 15

## 2022-05-19 MED ORDER — ALUM & MAG HYDROXIDE-SIMETH 200-200-20 MG/5ML PO SUSP
30.0000 mL | Freq: Once | ORAL | Status: AC
  Administered 2022-05-19: 30 mL via ORAL
  Filled 2022-05-19: qty 30

## 2022-05-19 MED ORDER — POTASSIUM CHLORIDE 20 MEQ PO PACK
40.0000 meq | PACK | Freq: Once | ORAL | Status: AC
Start: 2022-05-19 — End: 2022-05-19
  Administered 2022-05-19: 40 meq via ORAL
  Filled 2022-05-19: qty 2

## 2022-05-19 NOTE — ED Notes (Signed)
Dc instructions reviewed with pt. Pt will follow up with GI. No questions or concerns at this time. Declined wheelchair and ambulated without difficulty out of ED.

## 2022-05-19 NOTE — ED Notes (Signed)
Pt states that he has been dizzy from time to time over the past week; and reports some pain in head-temples over the past few weeks

## 2022-05-19 NOTE — ED Provider Notes (Signed)
[facility] EMERGENCY DEPARTMENT Provider Note   CSN: [id number] Arrival date & time: 05/19/22  0725     History  Chief Complaint  Patient presents with   Chest Pain    Earl Martinez is a 61 y.o. male.  HPI Patient presents for chest discomfort and difficulty swallowing.  Medical history includes pharyngeal esophageal dysphagia, globus pharyngeus, HLD, GERD, BPH, and seasonal allergies.  He has been seen by [facility]NT for his prior episodes of dysphagia.  He was treated with PPI.  He has no known cardiac history.  He continues to take his omeprazole as needed.  He did take a dose this morning.  Patient reports that he woke up in his normal state of health.  When he went to eat and drink, he had difficulty swallowing.  He developed discomfort in the upper aspect of his substernal chest.  This discomfort has continued.  He was able to tolerate small sips of water.  He is able to tolerate his oral secretions.  He denies any radiation of his pain.  He does experience some mild shortness of breath.  He denies any other symptoms.    Home Medications Prior to Admission medications   Medication Sig Start Date End Date Taking? Authorizing Provider  atorvastatin (LIPITOR) 40 MG tablet Take 1 tablet (40 mg total) by mouth daily. 07/18/21  Yes Stacks, Cletus Gash, MD  fluticasone [facility]) 50 MCG/ACT nasal spray SPRAY 2 SPRAYS INTO EACH NOSTRIL EVERY DAY 07/26/19  Yes Rakes, Connye Burkitt, FNP  Multiple Vitamin (MULTIVITAMIN) tablet Take 1 tablet by mouth daily.   Yes [provider]  omeprazole (PRILOSEC) 20 MG capsule Take 1 capsule (20 mg total) by mouth 2 (two) times daily. 07/18/21  Yes Claretta Fraise, MD  tamsulosin (FLOMAX) 0.4 MG CAPS capsule Take 1 capsule (0.4 mg total) by mouth daily. 07/18/21  Yes Claretta Fraise, MD  cetirizine (ZYRTEC) 10 MG tablet Take 1 tablet (10 mg total) by mouth daily. Patient not taking: Reported on 05/19/2022 02/13/16   Eustaquio Maize, MD  methocarbamol  (ROBAXIN) 500 MG tablet TAKE 1 TABLET BY MOUTH EVERY 8 HOURS AS NEEDED FOR MUSCLE SPASMS Patient not taking: Reported on 05/19/2022 03/20/22   Claretta Fraise, MD      Allergies    Patient has no known allergies.    Review of Systems   Review of Systems  HENT:  Positive for trouble swallowing.   Respiratory:  Positive for shortness of breath.   Cardiovascular:  Positive for chest pain.  All other systems reviewed and are negative.   Physical Exam Updated Vital Signs BP 126/80   Pulse (!) 56   Temp 97.9 F (36.6 C) (Oral)   Resp 17   Ht '5\' 11"'$  (1.803 m)   Wt 90.7 kg   SpO2 100%   BMI 27.89 kg/m  Physical Exam Vitals and nursing note reviewed.  Constitutional:      General: He is not in acute distress.    Appearance: He is well-developed and normal weight. He is not ill-appearing, toxic-appearing or diaphoretic.  HENT:     Head: Normocephalic and atraumatic.     Mouth/Throat:     Lips: Pink.     Mouth: Mucous membranes are moist. No oral lesions.     Pharynx: Oropharynx is clear. No pharyngeal swelling, oropharyngeal exudate or posterior oropharyngeal erythema.     Tonsils: No tonsillar exudate or tonsillar abscesses.     Comments: Absent uvula; mild petechiae on roof of  mouth Eyes:     Extraocular Movements: Extraocular movements intact.     Conjunctiva/sclera: Conjunctivae normal.  Neck:     Vascular: No JVD.  Cardiovascular:     Rate and Rhythm: Normal rate and regular rhythm.     Heart sounds: No murmur heard. Pulmonary:     Effort: Pulmonary effort is normal. No respiratory distress.     Breath sounds: Normal breath sounds. No decreased breath sounds, wheezing or rhonchi.  Chest:     Chest wall: No mass or tenderness.  Abdominal:     Palpations: Abdomen is soft.     Tenderness: There is no abdominal tenderness.  Musculoskeletal:        General: No swelling. Normal range of motion.     Cervical back: Normal range of motion and neck supple.     Right lower  leg: No edema.     Left lower leg: No edema.  Skin:    General: Skin is warm and dry.     Capillary Refill: Capillary refill takes less than 2 seconds.     Coloration: Skin is not pale.  Neurological:     General: No focal deficit present.     Mental Status: He is alert and oriented to person, place, and time.     Cranial Nerves: No cranial nerve deficit.     Motor: No weakness.  Psychiatric:        Mood and Affect: Mood normal.        Behavior: Behavior normal.     ED Results / Procedures / Treatments   Labs (all labs ordered are listed, but only abnormal results are displayed) Labs Reviewed  COMPREHENSIVE METABOLIC PANEL - Abnormal; Notable for the following components:      Result Value   Glucose, Bld 107 (*)    BUN 26 (*)    Anion gap 3 (*)    All other components within normal limits  CBC  MAGNESIUM  TROPONIN I (HIGH SENSITIVITY)  TROPONIN I (HIGH SENSITIVITY)    EKG EKG Interpretation  Date/Time:  Monday May 19 2022 07:45:34 EDT Ventricular Rate:  56 PR Interval:  169 QRS Duration: 104 QT Interval:  424 QTC Calculation: 410 R Axis:   73 Text Interpretation: Sinus rhythm Confirmed by Godfrey Pick ([id number]) on 05/19/2022 9:20:55 AM  Radiology DG Chest Portable 1 View  Result Date: 05/19/2022 CLINICAL DATA:  Chest pain and shortness of breath beginning this morning. EXAM: PORTABLE CHEST 1 VIEW COMPARISON:  None Available. FINDINGS: The heart size and mediastinal contours are within normal limits. Both lungs are clear. Cervical spine fusion hardware noted. IMPRESSION: No active disease. Electronically Signed   By: Marlaine Hind M.D.   On: 05/19/2022 08:05    Procedures Procedures    Medications Ordered in ED Medications  lactated ringers bolus 1,000 mL (0 mLs Intravenous Stopped 05/19/22 0906)  alum & mag hydroxide-simeth (MAALOX/MYLANTA) 200-200-20 MG/5ML suspension 30 mL (30 mLs Oral Given 05/19/22 0811)    And  lidocaine (XYLOCAINE) 2 % viscous mouth solution  15 mL (15 mLs Oral Given 05/19/22 0811)  diazepam (VALIUM) injection 2.5 mg (2.5 mg Intravenous Given 05/19/22 0812)  potassium chloride (KLOR-CON) packet 40 mEq (40 mEq Oral Given 05/19/22 1610)    ED Course/ Medical Decision Making/ A&P                           Medical Decision Making Amount and/or Complexity of Data Reviewed Labs:  ordered. Radiology: ordered.  Risk OTC drugs. Prescription drug management.   This patient presents to the ED for concern of chest pain and difficulty swallowing, this involves an extensive number of treatment options, and is a complaint that carries with it a high risk of complications and morbidity.  The differential diagnosis includes ACS, recurrence of globus pharyngeus, food bolus, esophagitis, GERD, Zenker's diverticulum, anxiety   Co morbidities that complicate the patient evaluation  pharyngeal esophageal dysphagia, globus pharyngeus, HLD, GERD, BPH, and seasonal allergies   Additional history obtained:  Additional history obtained from patient's wife External records from outside source obtained and reviewed including EMR   Lab Tests:  I Ordered, and personally interpreted labs.  The pertinent results include: Normal findings, including normal troponin x2   Imaging Studies ordered:  I ordered imaging studies including chest x-ray I independently visualized and interpreted imaging which showed no acute findings I agree with the radiologist interpretation   Cardiac Monitoring: / EKG:  The patient was maintained on a cardiac monitor.  I personally viewed and interpreted the cardiac monitored which showed an underlying rhythm of: Sinus rhythm  Problem List / ED Course / Critical interventions / Medication management  Patient is a 61 year old male presenting for onset of upper chest pain and difficulty swallowing this morning.  He states that he felt normal when waking up but feels that the symptoms came on when he tried to eat and  drink.  He has had similar symptoms in the past and did undergo evaluation by ENT several years ago.  Symptoms, at that time, were attributable to reflux.  He was prescribed omeprazole.  He states that he continues to take omeprazole but only as needed.  He states that he probably takes a dose about 5 days out of the week.  He did take a dose this morning.  Vital signs are normal on arrival.  EKG was obtained which did not show any ST segment abnormalities.  Patient is well-appearing on exam.  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  The surgery preceded his symptoms of dysphagia.  Patient was given GI cocktail and Valium for symptomatic relief.  IV fluids were given.  He underwent laboratory work-up, including troponins, which was all reassuring.  On chest x-ray, there were no abnormal findings.  Following therapeutic interventions, patient reported improved symptoms.  He was able to eat and drink in the ED.  He was given reassurance and advised to follow-up with his outpatient providers for any persistence of symptoms.  He was also advised to resume taking omeprazole daily.  Patient was discharged in good condition. I ordered medication including GI cocktail, Valium, IV fluids for symptomatic relief Reevaluation of the patient after these medicines showed that the patient resolved I have reviewed the patients home medicines and have made adjustments as needed   Social Determinants of Health:  Has access to outpatient care         Final Clinical Impression(s) / ED Diagnoses Final diagnoses:  Chest pain, unspecified type  Dysphagia, unspecified type    Rx / DC Orders ED Discharge Orders     None         Godfrey Pick, MD 05/19/22 1809

## 2022-05-19 NOTE — Discharge Instructions (Signed)
Continue to take your omeprazole daily.  Follow-up with your primary care doctor and ENT doctors for ongoing management of chronic symptoms.  There is a telephone number below to call for a gastroenterology follow-up.  Return to the emergency department for any new or worsening symptoms of concern.

## 2022-05-19 NOTE — ED Triage Notes (Signed)
Patient with complaints of chest pain that started this morning with slight shortness of breath. Also has complaints of difficulty swallowing that has been going on off and on for 6 years.

## 2022-05-19 NOTE — ED Notes (Signed)
Pt eating some graham crackers

## 2022-05-20 ENCOUNTER — Encounter: Payer: Self-pay | Admitting: Nurse Practitioner

## 2022-06-11 NOTE — Progress Notes (Unsigned)
06/11/2022 Earl Martinez [id number] 19-Sep-1961   CHIEF COMPLAINT: Difficulty swallowing,  HISTORY OF PRESENT ILLNESS: Earl Martinez is a 61 year old male with a past medical history of  hyperlipidemia, GERD, dysphagia, rectal abscess s/p surgical intervention in 2012. He presents to our office today for further evaluation regarding occasional dysphagia.  He describes feeling like his throat is closing when eating, gets strangled with associated coughing.  His first episode of dysphagia occurred following his mother's funeral in 2017.  Since then, he describes having episodes of difficulty swallowing with throat/esophageal tightening once or twice monthly.  He sometimes puts his chin to his chest to facilitate swallowing.  Eating foods such as steak takes more effort to swallow.  He feels he produces more saliva at times.  His wife stated he sometimes drools at nighttime.  On 05/19/2022, he took his morning medications and 2-3 bites of oatmeal which resulted in a choking episode with associated chest/esophageal pain. He presented to [facility] ED for further evaluation.  A chest xray was normal. EKG showed a normal sinus rhythm without acute ischemia. Troponin levels were normal. He received a GI cocktail and valium with symptom relief. He was able to eat and drink fluids and was discharged home with the instructions to take Omeprazole 20 mg twice daily which she continues to take.  No further chest pain since then.  He has infrequent heartburn.  In the past, he took Omeprazole as needed as prescribed by an ENT he saw several years ago.  He denies ever having an EGD.  He exercises on a regular basis without experiencing any chest pain, palpitations or shortness of breath.  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  He is passing normal formed brown bowel movement daily.  He passes a small amount of bright red blood on the toilet tissue which occurs  once or twice monthly, typically after passing a larger stool with straining.  He underwent a colonoscopy by Dr. Carlean Purl 01/02/2014 which was normal.  He is concerned about a lymph node behind his right ear.     Latest Ref Rng & Units 05/19/2022    7:51 AM 07/18/2021    1:46 PM 07/31/2020    8:47 AM  CBC  WBC 4.0 - 10.5 K/uL 8.0  6.4  6.0   Hemoglobin 13.0 - 17.0 g/dL 13.8  13.2  13.6   Hematocrit 39.0 - 52.0 % 42.3  38.3  40.8   Platelets 150 - 400 K/uL 174  165  155        Latest Ref Rng & Units 05/19/2022    7:51 AM 07/18/2021    1:46 PM 07/31/2020    8:47 AM  CMP  Glucose 70 - 99 mg/dL 107  96  101   BUN 6 - 20 mg/dL '26  19  22   '$ Creatinine 0.61 - 1.24 mg/dL 1.01  0.91  0.94   Sodium 135 - 145 mmol/L 138  143  143   Potassium 3.5 - 5.1 mmol/L 3.5  4.3  4.0   Chloride 98 - 111 mmol/L 107  107  106   CO2 22 - 32 mmol/L '28  23  26   '$ Calcium 8.9 - 10.3 mg/dL 9.1  9.1  9.7   Total Protein 6.5 - 8.1 g/dL 7.1  6.7  6.7   Total Bilirubin 0.3 - 1.2 mg/dL 0.7  0.2  0.4   Alkaline  Phos 38 - 126 U/L 59  77  78   AST 15 - 41 U/L '20  20  17   '$ ALT 0 - 44 U/L '26  19  17      '$ Past Medical History:  Diagnosis Date   Hyperlipemia    Neck pain    Rectal abscess    Seasonal allergies    Past Surgical History:  Procedure Laterality Date   BACK SURGERY     NASAL SEPTUM SURGERY     NECK SURGERY     RECTAL SURGERY  09/19/11   rectal abscess   ROTATOR CUFF REPAIR Right 02/19/2017   Dr. Theda Sers, [place] History: He is married.  He has 1 daughter.  He works for the [place] [employer].  Non-smoker.  No alcohol use.  No drug use  Family History: Mother deceased age 74 dementia. Father deceased in his 28's. No family history of  Paternal grandfather had a tracheostomy, further details are unclear. Sister with leukemia.  No known family history of esophageal, gastric or colon cancer.  No Known Allergies    Outpatient Encounter Medications as of 06/12/2022  Medication Sig    atorvastatin (LIPITOR) 40 MG tablet Take 1 tablet (40 mg total) by mouth daily.   cetirizine (ZYRTEC) 10 MG tablet Take 1 tablet (10 mg total) by mouth daily. (Patient not taking: Reported on 05/19/2022)   fluticasone (FLONASE) 50 MCG/ACT nasal spray SPRAY 2 SPRAYS INTO EACH NOSTRIL EVERY DAY   methocarbamol (ROBAXIN) 500 MG tablet TAKE 1 TABLET BY MOUTH EVERY 8 HOURS AS NEEDED FOR MUSCLE SPASMS (Patient not taking: Reported on 05/19/2022)   Multiple Vitamin (MULTIVITAMIN) tablet Take 1 tablet by mouth daily.   omeprazole (PRILOSEC) 20 MG capsule Take 1 capsule (20 mg total) by mouth 2 (two) times daily.   tamsulosin (FLOMAX) 0.4 MG CAPS capsule Take 1 capsule (0.4 mg total) by mouth daily.   No facility-administered encounter medications on file as of 06/12/2022.     REVIEW OF SYSTEMS: Gen: Denies fever, sweats or chills. No weight loss.  CV: Denies chest pain, palpitations or edema. Resp: Denies cough, shortness of breath of hemoptysis.  GI: See HPI.   GU : Denies urinary burning, blood in urine, increased urinary frequency or incontinence. MS: + Back pain. Derm: Denies rash, itchiness, skin lesions or unhealing ulcers. Psych: Denies depression, anxiety or memory loss. Heme: Denies bruising, easy bleeding. Neuro:  Denies headaches, dizziness or paresthesias. Endo:  Denies any problems with DM, thyroid or adrenal function.  PHYSICAL EXAM: Ht '5\' 10"'$  (1.778 m) Comment: height measured without shoes  Wt 199 lb 6 oz (90.4 kg)   BMI 28.61 kg/m   General: 61 year old male in no acute distress. Head: Normocephalic and atraumatic. Eyes:  Sclerae non-icteric, conjunctive pink. Ears: Normal auditory acuity. Mouth: Dentition intact. No ulcers or lesions.  Neck: Supple, no thyromegaly.  Small shotty left postauricular lymph node.  Lungs: Clear bilaterally to auscultation without wheezes, crackles or rhonchi. Heart: Regular rate and rhythm. No murmur, rub or gallop appreciated.  Abdomen: Soft,  nontender, non distended. No masses. No hepatosplenomegaly. Normoactive bowel sounds x 4 quadrants.  Rectal: Deferred. Musculoskeletal: Symmetrical with no gross deformities. Skin: Warm and dry. No rash or lesions on visible extremities. Extremities: No edema. Neurological: Alert oriented x 4, no focal deficits.  Psychological:  Alert and cooperative. Normal mood and affect.  ASSESSMENT AND PLAN:  41) 61 year old male with dysphagia -EGD benefits and risks  discussed including risk with sedation, risk of bleeding, perforation and infection  -Continue Omeprazole 20 mg twice daily -Patient instructed to avoid eating large pieces of meat/bread, cut food in small pieces and drink separate sips of water prior to swallowing any food or pills -Further recommendations to be determined after the above evaluation completed  2) Colon cancer screening. Normal colonoscopy 12/2013 -Next colonoscopy due 12/2023  3) Mild right postauricular lymph node, nontender but concerning to the patient at this time -Patient advised to follow-up with PCP     CC:  Lavella Lemons, PA

## 2022-06-12 ENCOUNTER — Encounter: Payer: Self-pay | Admitting: Nurse Practitioner

## 2022-06-12 ENCOUNTER — Ambulatory Visit: Payer: 59 | Admitting: Nurse Practitioner

## 2022-06-12 VITALS — BP 114/70 | HR 60 | Ht 70.0 in | Wt 199.4 lb

## 2022-06-12 DIAGNOSIS — K625 Hemorrhage of anus and rectum: Secondary | ICD-10-CM | POA: Diagnosis not present

## 2022-06-12 DIAGNOSIS — R1310 Dysphagia, unspecified: Secondary | ICD-10-CM

## 2022-06-12 NOTE — Patient Instructions (Addendum)
1) Avoid eating large pieces of bread or meat.  Cut food in small pieces, chew food thoroughly.  2) Benefiber 1 tablespoon daily to prevent straining to pass a bowel movement  You have been scheduled for an endoscopy and colonoscopy. Please follow the written instructions given to you at your visit today. Please pick up your prep supplies at the pharmacy within the next 1-3 days. If you use inhalers (even only as needed), please bring them with you on the day of your procedure.  If you are age 44 or older, your body mass index should be between 23-30. Your Body mass index is 28.61 kg/m. If this is out of the aforementioned range listed, please consider follow up with your Primary Care Provider.  If you are age 48 or younger, your body mass index should be between 19-25. Your Body mass index is 28.61 kg/m. If this is out of the aformentioned range listed, please consider follow up with your Primary Care Provider.   ________________________________________________________  The [facility] providers would like to encourage you to use [facility] to communicate with providers for non-urgent requests or questions.  Due to long hold times on the telephone, sending your provider a message by [facility] may be a faster and more efficient way to get a response.  Please allow 48 business hours for a response.  Please remember that this is for non-urgent requests.  _______________________________________________________

## 2022-06-23 ENCOUNTER — Encounter: Payer: Self-pay | Admitting: Internal Medicine

## 2022-06-23 ENCOUNTER — Ambulatory Visit (AMBULATORY_SURGERY_CENTER): Payer: 59 | Admitting: Internal Medicine

## 2022-06-23 VITALS — BP 116/65 | HR 58 | Temp 98.6°F | Resp 14 | Ht 70.0 in | Wt 199.0 lb

## 2022-06-23 DIAGNOSIS — D125 Benign neoplasm of sigmoid colon: Secondary | ICD-10-CM

## 2022-06-23 DIAGNOSIS — D127 Benign neoplasm of rectosigmoid junction: Secondary | ICD-10-CM

## 2022-06-23 DIAGNOSIS — K625 Hemorrhage of anus and rectum: Secondary | ICD-10-CM | POA: Diagnosis not present

## 2022-06-23 DIAGNOSIS — R1310 Dysphagia, unspecified: Secondary | ICD-10-CM | POA: Diagnosis not present

## 2022-06-23 DIAGNOSIS — K222 Esophageal obstruction: Secondary | ICD-10-CM

## 2022-06-23 DIAGNOSIS — D128 Benign neoplasm of rectum: Secondary | ICD-10-CM

## 2022-06-23 DIAGNOSIS — K648 Other hemorrhoids: Secondary | ICD-10-CM | POA: Diagnosis not present

## 2022-06-23 MED ORDER — SODIUM CHLORIDE 0.9 % IV SOLN: 500.0000 mL | INTRAVENOUS | Status: DC

## 2022-06-23 MED ORDER — OMEPRAZOLE 40 MG PO CPDR: 40.0000 mg | DELAYED_RELEASE_CAPSULE | Freq: Every day | ORAL | 3 refills | Status: DC

## 2022-06-23 NOTE — Op Note (Signed)
[place] Patient Name: Earl Martinez Procedure Date: 06/23/2022 2:24 PM MRN: [id number] Endoscopist: Gatha Mayer , MD Age: 61 Referring MD:  Date of Birth: 10-Jan-1961 Gender: Male Account #: [id number] Procedure:                Colonoscopy Indications:              Rectal bleeding Medicines:                Monitored Anesthesia Care Procedure:                Pre-Anesthesia Assessment:                           - Prior to the procedure, a History and Physical                            was performed, and patient medications and                            allergies were reviewed. The patient's tolerance of                            previous anesthesia was also reviewed. The risks                            and benefits of the procedure and the sedation                            options and risks were discussed with the patient.                            All questions were answered, and informed consent                            was obtained. Prior Anticoagulants: The patient has                            taken no previous anticoagulant or antiplatelet                            agents. ASA Grade Assessment: II - A patient with                            mild systemic disease. After reviewing the risks                            and benefits, the patient was deemed in                            satisfactory condition to undergo the procedure.                           After obtaining informed consent, the colonoscope  was passed under direct vision. Throughout the                            procedure, the patient's blood pressure, pulse, and                            oxygen saturations were monitored continuously. The                            CF [id number] #[id number] was introduced through the anus                            and advanced to the the cecum, identified by                            appendiceal orifice and ileocecal valve. The                             colonoscopy was performed without difficulty. The                            patient tolerated the procedure well. The quality                            of the bowel preparation was adequate. The bowel                            preparation used was Miralax via split dose                            instruction. The ileocecal valve, appendiceal                            orifice, and rectum were photographed. Scope In: 2:50:26 PM Scope Out: 3:08:20 PM Scope Withdrawal Time: 0 hours 14 minutes 11 seconds  Total Procedure Duration: 0 hours 17 minutes 54 seconds  Findings:                 The perianal and digital rectal examinations were                            normal. Pertinent negatives include normal prostate                            (size, shape, and consistency).                           Two sessile polyps were found in the proximal                            rectum and proximal sigmoid colon. The polyps were                            5 mm in size. These polyps were removed with a  cold                            snare. Resection and retrieval were complete.                            Verification of patient identification for the                            specimen was done. Estimated blood loss was minimal.                           Internal hemorrhoids were found.                           The exam was otherwise without abnormality on                            direct and retroflexion views. Complications:            No immediate complications. Estimated Blood Loss:     Estimated blood loss was minimal. Impression:               - Two 5 mm polyps in the proximal rectum and in the                            proximal sigmoid colon, removed with a cold snare.                            Resected and retrieved.                           - Internal hemorrhoids.                           - The examination was otherwise normal on direct                             and retroflexion views. Recommendation:           - Patient has a contact number available for                            emergencies. The signs and symptoms of potential                            delayed complications were discussed with the                            patient. Return to normal activities tomorrow.                            Written discharge instructions were provided to the                            patient.                           -  Resume previous diet.                           - Continue present medications.                           - Await pathology results.                           - Repeat colonoscopy is recommended. The                            colonoscopy date will be determined after pathology                            results from today's exam become available for                            review. Gatha Mayer, MD 06/23/2022 3:24:57 PM This report has been signed electronically.

## 2022-06-23 NOTE — Patient Instructions (Addendum)
There was a stricture (narrow area) at the junction of the esophagus and stomach - I dilated this. You should swallow better.  Changing omeprazole to one 40 mg each AM before breakfast. Stay on that to reduce the chances of this coming back again.Follow a reflux diet also (see handout).  I found and removed 2 small benign-appearing polyps during the colonoscopy. Also saw some internal hemorrhoids.  I will let you know pathology results and when to have another routine colonoscopy by mail and/or My Chart.  I appreciate the opportunity to care for you. Gatha Mayer, MD, [facility]  Handouts provided on polyps, hemorrhoids, reflux diet and post-dilation diet.   YOU HAD AN ENDOSCOPIC PROCEDURE TODAY AT [facility]:   Refer to the procedure report that was given to you for any specific questions about what was found during the examination.  If the procedure report does not answer your questions, please call your gastroenterologist to clarify.  If you requested that your care partner not be given the details of your procedure findings, then the procedure report has been included in a sealed envelope for you to review at your convenience later.  YOU SHOULD EXPECT: Some feelings of bloating in the abdomen. Passage of more gas than usual.  Walking can help get rid of the air that was put into your GI tract during the procedure and reduce the bloating. If you had a lower endoscopy (such as a colonoscopy or flexible sigmoidoscopy) you may notice spotting of blood in your stool or on the toilet paper. If you underwent a bowel prep for your procedure, you may not have a normal bowel movement for a few days.  Please Note:  You might notice some irritation and congestion in your nose or some drainage.  This is from the oxygen used during your procedure.  There is no need for concern and it should clear up in a day or so.  SYMPTOMS TO REPORT IMMEDIATELY:  Following lower endoscopy (colonoscopy or  flexible sigmoidoscopy):  Excessive amounts of blood in the stool  Significant tenderness or worsening of abdominal pains  Swelling of the abdomen that is new, acute  Fever of 100F or higher  Following upper endoscopy (EGD)  Vomiting of blood or coffee ground material  New chest pain or pain under the shoulder blades  Painful or persistently difficult swallowing  New shortness of breath  Fever of 100F or higher  Black, tarry-looking stools  For urgent or emergent issues, a gastroenterologist can be reached at any hour by calling [phone]. Do not use MyChart messaging for urgent concerns.    DIET: Post-dilation diet: Clear liquids for 1 hour (until 4:10pm). Then a Soft diet the rest of today (see handout.) You may resume your regular diet tomorrow.  Drink plenty of fluids but you should avoid alcoholic beverages for 24 hours.  ACTIVITY:  You should plan to take it easy for the rest of today and you should NOT DRIVE or use heavy machinery until tomorrow (because of the sedation medicines used during the test).    FOLLOW UP: Our staff will call the number listed on your records the next business day following your procedure.  We will call around 7:15- 8:00 am to check on you and address any questions or concerns that you may have regarding the information given to you following your procedure. If we do not reach you, we will leave a message.  If you develop any symptoms (ie: fever, flu-like  symptoms, shortness of breath, cough etc.) before then, please call [phone].  If you test positive for Covid 19 in the 2 weeks post procedure, please call and report this information to [place].    If any biopsies were taken you will be contacted by phone or by letter within the next 1-3 weeks.  Please call us at [phone] if you have not heard about the biopsies in 3 weeks.    SIGNATURES/CONFIDENTIALITY: You and/or your care partner have signed paperwork which will be entered into your  electronic medical record.  These signatures attest to the fact that that the information above on your After Visit Summary has been reviewed and is understood.  Full responsibility of the confidentiality of this discharge information lies with you and/or your care-partner.

## 2022-06-23 NOTE — Progress Notes (Signed)
Report to PACU, RN, vss, BBS= Clear.  

## 2022-06-23 NOTE — Progress Notes (Signed)
Called to room to assist during endoscopic procedure.  Patient ID and intended procedure confirmed with present staff. Received instructions for my participation in the procedure from the performing physician.  

## 2022-06-23 NOTE — Progress Notes (Signed)
Pt's states no medical or surgical changes since previsit or office visit. 

## 2022-06-23 NOTE — Op Note (Signed)
[place] Patient Name: Earl Martinez Procedure Date: 06/23/2022 2:33 PM MRN: [id number] Endoscopist: Gatha Mayer , MD Age: 61 Referring MD:  Date of Birth: [birth year] Gender: Male Account #: [id number] Procedure:                Upper GI endoscopy Indications:              Dysphagia Medicines:                Monitored Anesthesia Care Procedure:                Pre-Anesthesia Assessment:                           - Prior to the procedure, a History and Physical                            was performed, and patient medications and                            allergies were reviewed. The patient's tolerance of                            previous anesthesia was also reviewed. The risks                            and benefits of the procedure and the sedation                            options and risks were discussed with the patient.                            All questions were answered, and informed consent                            was obtained. Prior Anticoagulants: The patient has                            taken no previous anticoagulant or antiplatelet                            agents. ASA Grade Assessment: II - A patient with                            mild systemic disease. After reviewing the risks                            and benefits, the patient was deemed in                            satisfactory condition to undergo the procedure.                           After obtaining informed consent, the endoscope was  passed under direct vision. Throughout the                            procedure, the patient's blood pressure, pulse, and                            oxygen saturations were monitored continuously. The                            GIF [id number] #[id number] was introduced through the                            mouth, and advanced to the second part of duodenum.                            The upper GI endoscopy was accomplished without                             difficulty. The patient tolerated the procedure                            well. Scope In: Scope Out: Findings:                 One benign-appearing, intrinsic moderate                            (circumferential scarring or stenosis; an endoscope                            may pass) stenosis was found at the                            gastroesophageal junction. The stenosis was                            traversed. A TTS dilator was passed through the                            scope. Dilation with an 18-19-20 mm balloon dilator                            was performed to 20 mm. The dilation site was                            examined and showed mild mucosal disruption.                            Estimated blood loss was minimal.                           The exam was otherwise without abnormality.                           The cardia and gastric fundus were normal on  retroflexion. Complications:            No immediate complications. Estimated Blood Loss:     Estimated blood loss was minimal. Impression:               - Benign-appearing esophageal stenosis. Dilated. 20                            mm. Looks like mild stricture from GERD                           - The examination was otherwise normal.                           - No specimens collected. Recommendation:           - Patient has a contact number available for                            emergencies. The signs and symptoms of potential                            delayed complications were discussed with the                            patient. Return to normal activities tomorrow.                            Written discharge instructions were provided to the                            patient.                           - Clear liquids x 1 hour then soft foods rest of                            day. Start prior diet tomorrow.                           - Follow an antireflux  regimen. This includes:                           - Do not lie down for at least 3 to 4 hours after                            meals.                           - Raise the head of the bed 4 to 6 inches.                           - Decrease excess weight.                           - Avoid citrus juices and other acidic foods,  alcohol, chocolate, mints, coffee and other                            caffeinated beverages, carbonated beverages, fatty                            and fried foods.                           - Avoid tight-fitting clothing.                           - Avoid cigarettes and other tobacco products.                           - Change omeprazole to 40 mg q AM Gatha Mayer, MD 06/23/2022 3:22:28 PM This report has been signed electronically.

## 2022-06-23 NOTE — Progress Notes (Signed)
History and Physical Interval Note:  06/23/2022 2:32 PM  Earl Martinez  has presented today for endoscopic procedure(s), with the diagnosis of  Encounter Diagnoses  Name Primary?   Rectal bleeding Yes   Dysphagia, unspecified type   .  The various methods of evaluation and treatment have been discussed with the patient and/or family. After consideration of risks, benefits and other options for treatment, the patient has consented to  the endoscopic procedure(s).   The patient's history has been reviewed, patient examined, no change in status, stable for endoscopic procedure(s).  I have reviewed the patient's chart and labs.  Questions were answered to the patient's satisfaction.     Gatha Mayer, MD, [facility]

## 2022-06-24 ENCOUNTER — Telehealth: Payer: Self-pay

## 2022-06-24 NOTE — Telephone Encounter (Signed)
  Follow up Call-     06/23/2022    1:50 PM  Call back number  Post procedure Call Back phone  # [phone]  Permission to leave phone message Yes     Patient questions:  Do you have a fever, pain , or abdominal swelling? No. Pain Score  0 *  Have you tolerated food without any problems? Yes.    Have you been able to return to your normal activities? Yes.    Do you have any questions about your discharge instructions: Diet   No. Medications  No. Follow up visit  No.  Do you have questions or concerns about your Care? No.  Actions: * If pain score is 4 or above: No action needed, pain <4.

## 2022-06-27 ENCOUNTER — Encounter: Payer: Self-pay | Admitting: Internal Medicine

## 2022-06-27 DIAGNOSIS — Z860101 Personal history of adenomatous and serrated colon polyps: Secondary | ICD-10-CM | POA: Insufficient documentation

## 2022-06-27 DIAGNOSIS — Z86010 Personal history of colonic polyps: Secondary | ICD-10-CM | POA: Insufficient documentation

## 2022-06-27 DIAGNOSIS — K219 Gastro-esophageal reflux disease without esophagitis: Secondary | ICD-10-CM

## 2022-06-27 HISTORY — DX: Personal history of adenomatous and serrated colon polyps: Z86.0101

## 2022-06-27 HISTORY — DX: Gastro-esophageal reflux disease without esophagitis: K21.9

## 2022-07-14 ENCOUNTER — Other Ambulatory Visit: Payer: Self-pay | Admitting: Family Medicine

## 2022-07-14 DIAGNOSIS — N401 Enlarged prostate with lower urinary tract symptoms: Secondary | ICD-10-CM

## 2022-08-01 ENCOUNTER — Other Ambulatory Visit: Payer: Self-pay | Admitting: Family Medicine

## 2022-08-01 DIAGNOSIS — N401 Enlarged prostate with lower urinary tract symptoms: Secondary | ICD-10-CM

## 2023-02-04 IMAGING — DX DG CHEST 1V PORT
1 series · 1 of 1 positions shown · non-contrast
Comparison: None Available.

CLINICAL DATA: Chest pain and shortness of breath beginning this
morning.

EXAM:
PORTABLE CHEST 1 VIEW

[chest ap]
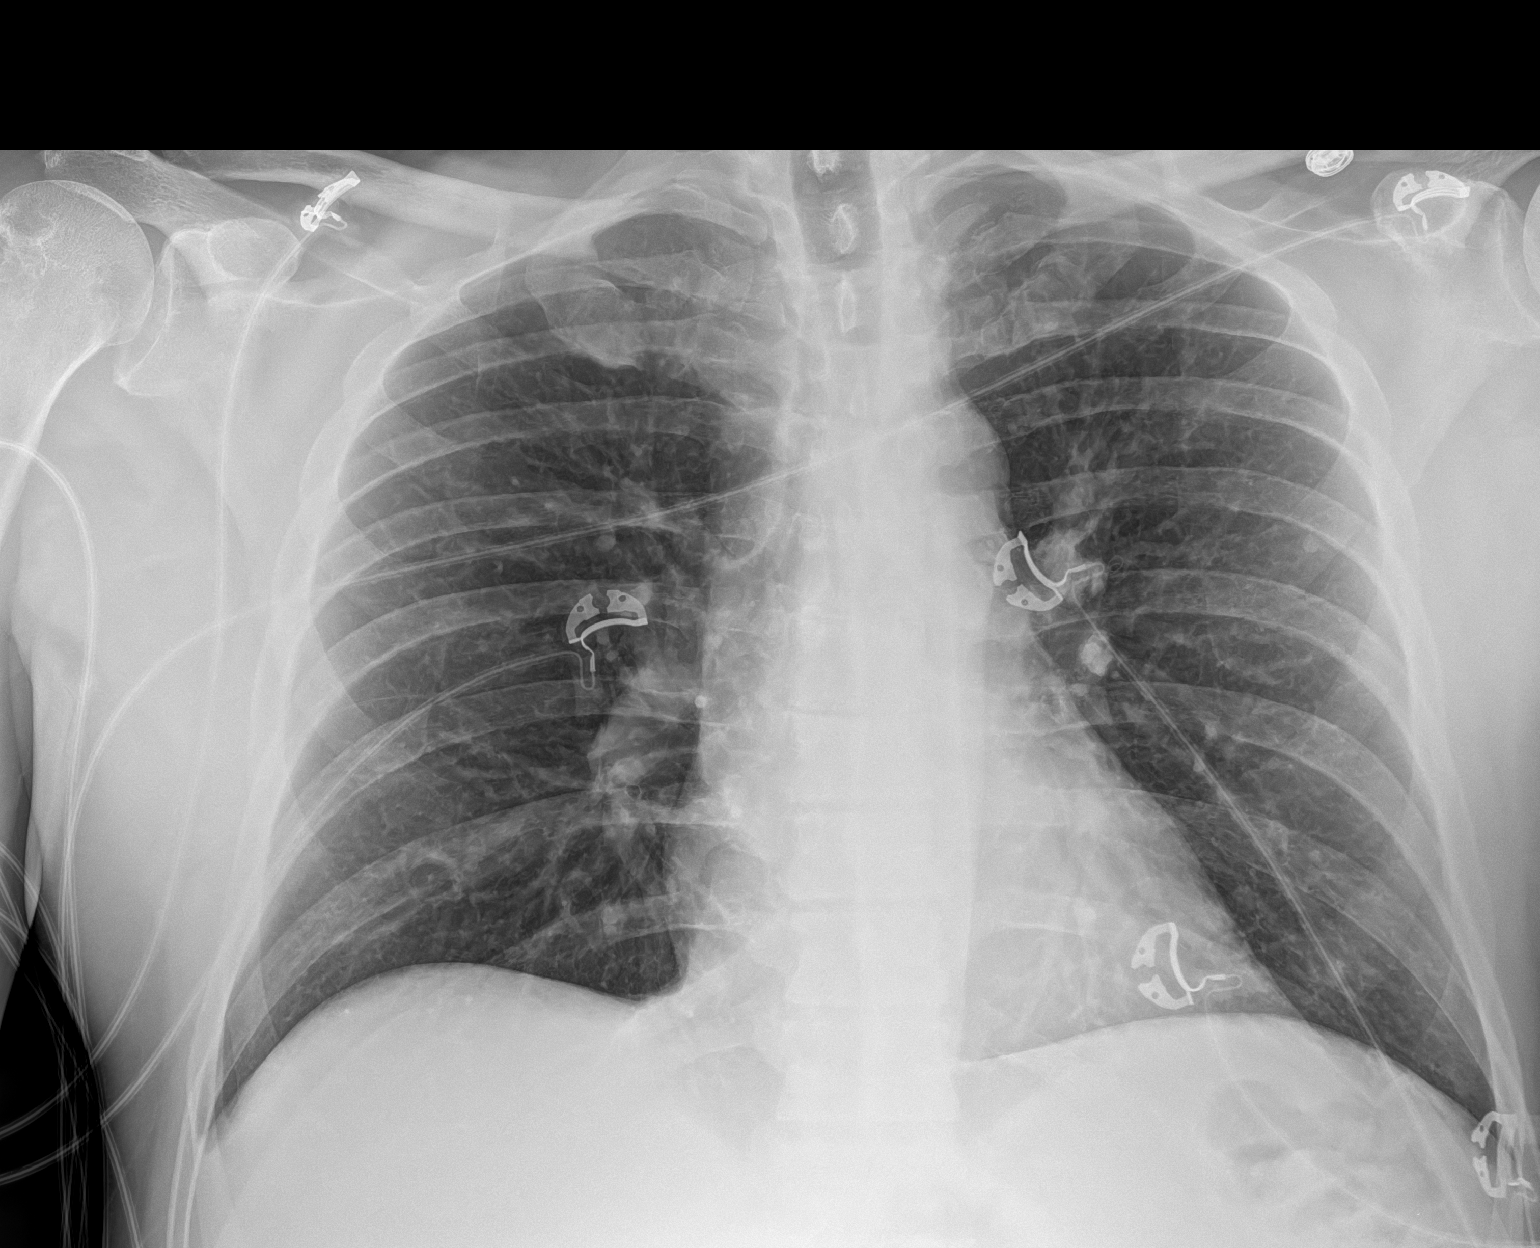

[1 of 1 positions shown; findings below may reference images not displayed]

FINDINGS: The heart size and mediastinal contours are within normal limits.
Both lungs are clear. Cervical spine fusion hardware noted.
IMPRESSION: No active disease.

## 2023-07-17 ENCOUNTER — Other Ambulatory Visit: Payer: Self-pay | Admitting: Internal Medicine

## 2023-10-19 ENCOUNTER — Other Ambulatory Visit: Payer: Self-pay | Admitting: Urology

## 2023-10-26 ENCOUNTER — Encounter (HOSPITAL_BASED_OUTPATIENT_CLINIC_OR_DEPARTMENT_OTHER): Payer: Self-pay | Admitting: Urology

## 2023-10-26 NOTE — Progress Notes (Signed)
Spoke w/ via phone for pre-op interview--- [facility] Lab needs dos---- NONE        Lab results------ COVID test -----patient states asymptomatic no test needed Arrive at -------0900 NPO after MN NO Solid Food.  Clear liquids from MN until---0800 Med rec completed Medications to take morning of surgery -----Prilosec and Flomax Diabetic medication ----- Patient instructed no nail polish to be worn day of surgery Patient instructed to bring photo id and insurance card day of surgery Patient aware to have Driver (ride ) / caregiver    for 24 hours after surgery - Janelle Floor Patient Special Instructions ----- Pre-Op special Instructions ----- Patient verbalized understanding of instructions that were given at this phone interview. Patient denies chest pain, sob, fever, cough at the interview.

## 2023-11-02 NOTE — H&P (Signed)
CC: Frequency and urgency.   02/11/23: Earl Martinez returns today with a 6 months history of frequency and urgency. He has not had UUI. He has no flank pain. He has some intermittancy. He has some nocturnal hesitancy and a reduced stream and nocturia x 2. He has had no hematuria or dysuria. He had a UA in 2021 that had 11-20 RBC's and a CT that was negative. He didn't have flank pain with that episode. He has been seen by [place] in the past for Peyronies and has a minor residual curve and some intermittent ED but is generally functional. He has been on tamsulosin for several year.   04/01/23: Earl Martinez returns today in f/u. He hasn't noticed a benefit from the change to silodosin. His IPSS is 24. He had the Urocuff and PVR. His PVR was 17ml and the urocuff had an excellent flow with an unobstructed PF. His PSA was 3.6 in 8/23, 3.8 in 8/22 and 2.22 in 2018.   07/02/2023:  Patient presents with his wife at his side for consultation on UroLift. He continues on tamsulosin 0.4 mg. Patient states he has tried Myrbetriq, which has not helped. Today, he states that his voiding function has worsened. IPSS 28, QOL well 6, terrible. He would like to discuss minimally invasive procedures, favoring UroLift. He is concerned about surgical intervention/TURP, and would like to avoid this if possible. Today, he he denies dysuria, gross hematuria, flank pain, fever/chills, nausea/vomiting.   10/16/23: Earl Martinez returns today in f/u. His prostate is 66ml and on Urodynamics he had a stable bladder with outlet obstruction. I had tried to refer him for PAE but his insurance denied approval. He is interested in trying Urolift or changing insurances to get the PAE authorized. He remains on tamsulosin bid.   UDS results: Earl Martinez held a max capacity of approx. 763 mls. His 1st sensation was felt at 135 mls. No instability was noted. He was able to generate a voluntary contraction and void 553 mls with max flow of 8 ml/s. Max detrusor pressure  while voiding was 47 mls. He had to strain at the end of his flow in order to empty his bladder. EMG leads were basically quiet during the voiding phase. PVR was approx. 210 mls. No trabeculation was noted. No reflux was seen.     ALLERGIES: No Allergies    MEDICATIONS: Omeprazole 40 mg capsule,delayed release  Tamsulosin Hcl 0.4 mg capsule 2 capsule PO Daily  Advil Pm  Atorvastatin Calcium 40 mg tablet tablet PO Q2WK  Cetirizine Hcl  Excedrin Extra Strength  Fish Oil CAPS Oral  Fluticasone Propionate  Men's 50 Plus Daily Formula  Stool Softener  Turmeric     GU PSH: Complex cystometrogram, w/ void pressure and urethral pressure profile studies, any technique - 09/16/2023 Complex cystometrogram, with voiding pressure studies, any technique - 03/16/2023 Complex Uroflow - 09/16/2023, 03/16/2023 Cystoscopy - 04/01/2023 Emg surf Electrd - 09/16/2023, 03/16/2023 Inject For cystogram - 09/16/2023 Intrabd voidng Press - 09/16/2023 Penile Injection - 2019       PSH Notes: Back Surgery, ENT Surgical Result - Throat, Nose Surgery, Rectal Surgery, Ear Surgery, Neck Surgery   NON-GU PSH: Rectal Surgery (Unspecified) - 2016 Shoulder Surgery (Unspecified), Right     GU PMH: Urinary Frequency - 09/16/2023, - 04/01/2023, - 03/16/2023, - 02/11/2023 Urinary Urgency - 09/16/2023, - 04/01/2023, - 03/16/2023, - 02/11/2023 BPH w/LUTS - 07/02/2023, The Urocuff is not consistent with obstruction and his PVR is 17ml but he has visual obstruction  on cystoscopy. His symptoms are primarily irritative so I will give him a trial of Myrbetriq but if that isn't effective, I will consider an outlet procedure. Side effects and instructions for Myrbetriq reviewed. He was given both 25mg  and 50mg  samples., - 04/01/2023, - 03/16/2023, He has some increased frequency and urgency over the last 6 months. I discussed avoiding dietary irritants and will change him to silodosin from tamsulosin. I will also have him return for a Urocuff, PVR and  possibly cystoscopy to better assess his symptoms. I have requested labs from [facility] to see his most recent PSA. , - 02/11/2023, - 2018 Nocturia - 04/01/2023, - 02/11/2023, - 2018, Nocturia, - 2014 Peyronies Disease (Stable), He will try the intracavernosal injections and contact me if he is having any difficulty. - 2019, (Stable), He will contact me if he would like to proceed with surgery or possibly with Xiaflex. If he does choose to consider Xiaflex I told him we would need to have him come in for an intracavernosal injection of prostate gland in order to document the amount of curvature or alternatively he could photograph the erection so that I could attempt to determine the amount of curvature present., - 2019, He has had minimal progression. I have suggested he try a VED to help stretch the tissues but warned him that it is not likely to eliminate the problem. I am going to have him see Earl Martinez for further evaluation. , - 2018, - 2018 Disorder Of Penis Unspec, Penile lesion - 2016 ED due to arterial insufficiency, Erectile dysfunction due to arterial insufficiency - 2016    NON-GU PMH: Encounter for general adult medical examination without abnormal findings, Encounter for preventive health examination - 2016 Personal history of other diseases of the circulatory system, History of hypertension - 2014 Personal history of other diseases of the digestive system, History of esophageal reflux - 2014 Personal history of other endocrine, nutritional and metabolic disease, History of hypercholesterolemia - 2014 GERD Hypercholesterolemia Hypertension    FAMILY HISTORY: Asthma - Runs In Family Benign hematuria - Runs In Family Death In The Family Father - Other Death In The Family Mother - Other prostate - Father   SOCIAL HISTORY: Marital Status: Married Preferred Language: English; Ethnicity: Not Hispanic Or Latino; Race: White Current Smoking Status: Patient has never smoked.   Tobacco  Use Assessment Completed: Used Tobacco in last 30 days? Does not use smokeless tobacco. Has never drank.  Does not use drugs. Drinks 1 caffeinated drink per day.     Notes: Never smoker, Alcohol Use, Tobacco Use, Caffeine Use, Marital History - Currently Married, Occupation:   REVIEW OF SYSTEMS:    GU Review Male:   Patient denies frequent urination, hard to postpone urination, burning/ pain with urination, get up at night to urinate, leakage of urine, stream starts and stops, trouble starting your stream, have to strain to urinate , erection problems, and penile pain.  Gastrointestinal (Upper):   Patient denies nausea, indigestion/ heartburn, and vomiting.  Gastrointestinal (Lower):   Patient denies diarrhea and constipation.  Constitutional:   Patient denies fever, night sweats, weight loss, and fatigue.  Skin:   Patient denies skin rash/ lesion and itching.  Eyes:   Patient denies blurred vision and double vision.  Ears/ Nose/ Throat:   Patient denies sore throat and sinus problems.  Hematologic/Lymphatic:   Patient denies swollen glands and easy bruising.  Cardiovascular:   Patient denies leg swelling and chest pains.  Respiratory:   Patient  denies cough and shortness of breath.  Endocrine:   Patient denies excessive thirst.  Musculoskeletal:   Patient denies back pain and joint pain.  Neurological:   Patient denies headaches and dizziness.  Psychologic:   Patient denies depression and anxiety.   VITAL SIGNS: None   Complexity of Data:  Records Review:   Previous Patient Records  Urine Test Review:   Urinalysis  Urodynamics Review:   Review Urodynamics Tests   07/29/22 07/31/20 11/12/17  PSA  Total PSA 3.6 ng/ml 3.8 ng/ml 2.22 ng/mL    PROCEDURES:         Prostate Ultrasound - 91478  Length:5.57cm Height:4.03cm Width:5.56cm Volume:66.23ml      The transrectal ultrasound probe is introduced into the rectum, and the prostate is visualized. Ultrasonography is utilized  throughout the procedure. At the conclusion of the procedure, the ultrasound probe is removed. The patient tolerates the procedure without complication.   NO prostate lesions noted and there is no middle lobe present.  . Patient confirmed No Neulasta OnPro Device.           Visit Complexity - G2211 Chronic management         Urinalysis Dipstick Dipstick Cont'd  Color: Yellow Bilirubin: Neg mg/dL  Appearance: Clear Ketones: Neg mg/dL  Specific Gravity: <=2.956 Blood: Neg ery/uL  pH: <=5.0 Protein: Neg mg/dL  Glucose: Neg mg/dL Urobilinogen: 0.2 mg/dL    Nitrites: Neg    Leukocyte Esterase: Neg leu/uL    ASSESSMENT:      ICD-10 Details  1 GU:   BPH w/LUTS - N40.1 Chronic, Stable - He has a 66 ml prostate with urodynamic evidence of obstruction. he had been interested in PAE but has not been able to get that authorized. I discussed multiple other options and he would like to proceed with Urolift. I reviewed the risks again in detail and will get him scheduled.   2   Nocturia - R35.1 Chronic, Stable  3   Urinary Urgency - R39.15 Chronic, Stable  4   Urinary Frequency - R35.0 Chronic, Stable     PLAN:           Schedule Return Visit/Planned Activity: Next Available Appointment - Schedule Surgery             Note: Urolift

## 2023-11-03 ENCOUNTER — Ambulatory Visit (HOSPITAL_BASED_OUTPATIENT_CLINIC_OR_DEPARTMENT_OTHER): Payer: Self-pay | Admitting: Anesthesiology

## 2023-11-03 ENCOUNTER — Ambulatory Visit (HOSPITAL_BASED_OUTPATIENT_CLINIC_OR_DEPARTMENT_OTHER): Payer: 59 | Admitting: Anesthesiology

## 2023-11-03 ENCOUNTER — Ambulatory Visit (HOSPITAL_BASED_OUTPATIENT_CLINIC_OR_DEPARTMENT_OTHER)
Admission: RE | Admit: 2023-11-03 | Discharge: 2023-11-03 | Disposition: A | Discharge status: Home / Self Care | Payer: 59 | Attending: Urology | Admitting: Urology

## 2023-11-03 ENCOUNTER — Encounter (HOSPITAL_BASED_OUTPATIENT_CLINIC_OR_DEPARTMENT_OTHER): Payer: Self-pay | Admitting: Urology

## 2023-11-03 ENCOUNTER — Encounter (HOSPITAL_BASED_OUTPATIENT_CLINIC_OR_DEPARTMENT_OTHER)
Admission: RE | Disposition: A | Discharge status: Home / Self Care | Payer: Self-pay | Source: Home / Self Care | Attending: Urology

## 2023-11-03 DIAGNOSIS — N138 Other obstructive and reflux uropathy: Secondary | ICD-10-CM | POA: Insufficient documentation

## 2023-11-03 DIAGNOSIS — R350 Frequency of micturition: Secondary | ICD-10-CM | POA: Diagnosis not present

## 2023-11-03 DIAGNOSIS — K219 Gastro-esophageal reflux disease without esophagitis: Secondary | ICD-10-CM | POA: Diagnosis not present

## 2023-11-03 DIAGNOSIS — Z79899 Other long term (current) drug therapy: Secondary | ICD-10-CM | POA: Diagnosis not present

## 2023-11-03 DIAGNOSIS — N401 Enlarged prostate with lower urinary tract symptoms: Secondary | ICD-10-CM | POA: Insufficient documentation

## 2023-11-03 DIAGNOSIS — R3911 Hesitancy of micturition: Secondary | ICD-10-CM | POA: Diagnosis not present

## 2023-11-03 DIAGNOSIS — N320 Bladder-neck obstruction: Secondary | ICD-10-CM | POA: Diagnosis not present

## 2023-11-03 DIAGNOSIS — I10 Essential (primary) hypertension: Secondary | ICD-10-CM | POA: Diagnosis not present

## 2023-11-03 DIAGNOSIS — R351 Nocturia: Secondary | ICD-10-CM | POA: Insufficient documentation

## 2023-11-03 DIAGNOSIS — R3912 Poor urinary stream: Secondary | ICD-10-CM | POA: Insufficient documentation

## 2023-11-03 DIAGNOSIS — R3915 Urgency of urination: Secondary | ICD-10-CM | POA: Diagnosis not present

## 2023-11-03 HISTORY — PX: CYSTOSCOPY WITH INSERTION OF UROLIFT: SHX6678

## 2023-11-03 SURGERY — CYSTOSCOPY WITH INSERTION OF UROLIFT
Anesthesia: General | Site: Prostate

## 2023-11-03 MED ORDER — FENTANYL CITRATE (PF) 100 MCG/2ML IJ SOLN
INTRAMUSCULAR | Status: DC | PRN
  Administered 2023-11-03 (×4): 25 ug via INTRAVENOUS

## 2023-11-03 MED ORDER — SODIUM CHLORIDE 0.9 % IR SOLN
Status: DC | PRN
  Administered 2023-11-03 (×2): 3000 mL

## 2023-11-03 MED ORDER — ONDANSETRON HCL 4 MG/2ML IJ SOLN
INTRAMUSCULAR | Status: AC
  Filled 2023-11-03: qty 2

## 2023-11-03 MED ORDER — ACETAMINOPHEN 325 MG PO TABS: 325.0000 mg | ORAL_TABLET | ORAL | Status: DC | PRN

## 2023-11-03 MED ORDER — LACTATED RINGERS IV SOLN: INTRAVENOUS | Status: DC

## 2023-11-03 MED ORDER — HYDROCODONE-ACETAMINOPHEN 5-325 MG PO TABS: 2.0000 | ORAL_TABLET | Freq: Four times a day (QID) | ORAL | 0 refills | Status: AC | PRN

## 2023-11-03 MED ORDER — PROPOFOL 10 MG/ML IV BOLUS
INTRAVENOUS | Status: AC
Start: 2023-11-03 — End: ?
  Filled 2023-11-03: qty 20

## 2023-11-03 MED ORDER — SULFAMETHOXAZOLE-TRIMETHOPRIM 800-160 MG PO TABS: 1.0000 | ORAL_TABLET | Freq: Two times a day (BID) | ORAL | 0 refills | Status: AC

## 2023-11-03 MED ORDER — MEPERIDINE HCL 25 MG/ML IJ SOLN: 6.2500 mg | INTRAMUSCULAR | Status: DC | PRN

## 2023-11-03 MED ORDER — FENTANYL CITRATE (PF) 100 MCG/2ML IJ SOLN
25.0000 ug | INTRAMUSCULAR | Status: DC | PRN
Start: 2023-11-03 — End: 2023-11-03

## 2023-11-03 MED ORDER — ACETAMINOPHEN 160 MG/5ML PO SOLN: 325.0000 mg | ORAL | Status: DC | PRN

## 2023-11-03 MED ORDER — CEFAZOLIN SODIUM-DEXTROSE 2-4 GM/100ML-% IV SOLN
2.0000 g | INTRAVENOUS | Status: AC
  Administered 2023-11-03: 2 g via INTRAVENOUS

## 2023-11-03 MED ORDER — ONDANSETRON HCL 4 MG/2ML IJ SOLN
4.0000 mg | Freq: Once | INTRAMUSCULAR | Status: DC | PRN
Start: 2023-11-03 — End: 2023-11-03

## 2023-11-03 MED ORDER — LIDOCAINE HCL (PF) 2 % IJ SOLN
INTRAMUSCULAR | Status: AC
  Filled 2023-11-03: qty 5

## 2023-11-03 MED ORDER — LIDOCAINE HCL (CARDIAC) PF 100 MG/5ML IV SOSY
PREFILLED_SYRINGE | INTRAVENOUS | Status: DC | PRN
  Administered 2023-11-03: 100 mg via INTRAVENOUS

## 2023-11-03 MED ORDER — ACETAMINOPHEN 500 MG PO TABS
ORAL_TABLET | ORAL | Status: AC
  Filled 2023-11-03: qty 2

## 2023-11-03 MED ORDER — OXYCODONE HCL 5 MG PO TABS
ORAL_TABLET | ORAL | Status: AC
  Filled 2023-11-03: qty 1

## 2023-11-03 MED ORDER — OXYCODONE HCL 5 MG PO TABS
5.0000 mg | ORAL_TABLET | Freq: Once | ORAL | Status: AC | PRN
Start: 2023-11-03 — End: 2023-11-03
  Administered 2023-11-03: 5 mg via ORAL

## 2023-11-03 MED ORDER — ACETAMINOPHEN 500 MG PO TABS
1000.0000 mg | ORAL_TABLET | Freq: Once | ORAL | Status: AC
  Administered 2023-11-03: 1000 mg via ORAL

## 2023-11-03 MED ORDER — CEFAZOLIN SODIUM-DEXTROSE 2-4 GM/100ML-% IV SOLN
INTRAVENOUS | Status: AC
  Filled 2023-11-03: qty 100

## 2023-11-03 MED ORDER — MIDAZOLAM HCL 5 MG/5ML IJ SOLN
INTRAMUSCULAR | Status: DC | PRN
  Administered 2023-11-03: 2 mg via INTRAVENOUS

## 2023-11-03 MED ORDER — ONDANSETRON HCL 4 MG/2ML IJ SOLN
INTRAMUSCULAR | Status: DC | PRN
  Administered 2023-11-03: 4 mg via INTRAVENOUS

## 2023-11-03 MED ORDER — SODIUM CHLORIDE 0.9 % IV SOLN: INTRAVENOUS | Status: DC

## 2023-11-03 MED ORDER — SODIUM CHLORIDE 0.9% FLUSH: 3.0000 mL | Freq: Two times a day (BID) | INTRAVENOUS | Status: DC

## 2023-11-03 MED ORDER — DEXAMETHASONE SODIUM PHOSPHATE 10 MG/ML IJ SOLN
INTRAMUSCULAR | Status: AC
Start: 2023-11-03 — End: ?
  Filled 2023-11-03: qty 1

## 2023-11-03 MED ORDER — DEXAMETHASONE SODIUM PHOSPHATE 4 MG/ML IJ SOLN
INTRAMUSCULAR | Status: DC | PRN
  Administered 2023-11-03: 5 mg via INTRAVENOUS

## 2023-11-03 MED ORDER — CELECOXIB 200 MG PO CAPS
ORAL_CAPSULE | ORAL | Status: AC
  Filled 2023-11-03: qty 1

## 2023-11-03 MED ORDER — OXYCODONE HCL 5 MG/5ML PO SOLN: 5.0000 mg | Freq: Once | ORAL | Status: AC | PRN

## 2023-11-03 MED ORDER — CELECOXIB 200 MG PO CAPS
200.0000 mg | ORAL_CAPSULE | Freq: Once | ORAL | Status: AC
  Administered 2023-11-03: 200 mg via ORAL

## 2023-11-03 MED ORDER — FENTANYL CITRATE (PF) 100 MCG/2ML IJ SOLN
INTRAMUSCULAR | Status: AC
  Filled 2023-11-03: qty 2

## 2023-11-03 MED ORDER — MIDAZOLAM HCL 2 MG/2ML IJ SOLN
INTRAMUSCULAR | Status: AC
Start: 2023-11-03 — End: ?
  Filled 2023-11-03: qty 2

## 2023-11-03 MED ORDER — PROPOFOL 10 MG/ML IV BOLUS
INTRAVENOUS | Status: DC | PRN
  Administered 2023-11-03: 200 mg via INTRAVENOUS

## 2023-11-03 MED ORDER — EPHEDRINE SULFATE (PRESSORS) 50 MG/ML IJ SOLN
INTRAMUSCULAR | Status: DC | PRN
  Administered 2023-11-03: 10 mg via INTRAVENOUS

## 2023-11-03 SURGICAL SUPPLY — 21 items
BAG DRAIN URO-CYSTO SKYTR STRL (DRAIN) ×1 IMPLANT
BAG URINE DRAIN 2000ML AR STRL (UROLOGICAL SUPPLIES) IMPLANT
CATH FOLEY 2WAY SLVR 5CC 18FR (CATHETERS) IMPLANT
CLOTH BEACON ORANGE TIMEOUT ST (SAFETY) ×1 IMPLANT
COVER SURGICAL LIGHT HANDLE (MISCELLANEOUS) ×1 IMPLANT
GLOVE BIOGEL PI IND STRL 7.0 (GLOVE) IMPLANT
GLOVE BIOGEL PI IND STRL 7.5 (GLOVE) IMPLANT
GLOVE SURG SS PI 7.0 STRL IVOR (GLOVE) IMPLANT
GLOVE SURG SS PI 8.0 STRL IVOR (GLOVE) ×1 IMPLANT
GOWN STRL REUS W/TWL LRG LVL3 (GOWN DISPOSABLE) IMPLANT
GOWN STRL REUS W/TWL XL LVL3 (GOWN DISPOSABLE) ×1 IMPLANT
HOLDER FOLEY CATH W/STRAP (MISCELLANEOUS) IMPLANT
IV NS IRRIG 3000ML ARTHROMATIC (IV SOLUTION) IMPLANT
KIT TURNOVER CYSTO (KITS) ×1 IMPLANT
MANIFOLD NEPTUNE II (INSTRUMENTS) ×1 IMPLANT
PACK CYSTO (CUSTOM PROCEDURE TRAY) ×1 IMPLANT
SLEEVE SCD COMPRESS KNEE MED (STOCKING) ×1 IMPLANT
SYSTEM UROLIFT (Male Continence) IMPLANT
SYSTEM UROLIFT 2 CART W/ HNDL (Male Continence) IMPLANT
SYSTEM UROLIFT 2 CARTRIDGE (Male Continence) IMPLANT
TUBE CONNECTING 12X1/4 (SUCTIONS) ×1 IMPLANT

## 2023-11-03 NOTE — Discharge Instructions (Addendum)
You may remove that catheter in the morning and if you don't feel you can, please contact the office.              No acetaminophen/Tylenol until after 3:45 pm today if needed.  No ibuprofen, Advil, Aleve, Motrin, ketorolac, meloxicam, naproxen, or other NSAIDS until after 3:45 pm today if needed.     Post Anesthesia Home Care Instructions  Activity: Get plenty of rest for the remainder of the day. A responsible individual must stay with you for 24 hours following the procedure.  For the next 24 hours, DO NOT: -Drive a car -Advertising copywriter -Drink alcoholic beverages -Take any medication unless instructed by your physician -Make any legal decisions or sign important papers.  Meals: Start with liquid foods such as gelatin or soup. Progress to regular foods as tolerated. Avoid greasy, spicy, heavy foods. If nausea and/or vomiting occur, drink only clear liquids until the nausea and/or vomiting subsides. Call your physician if vomiting continues.  Special Instructions/Symptoms: Your throat may feel dry or sore from the anesthesia or the breathing tube placed in your throat during surgery. If this causes discomfort, gargle with warm salt water. The discomfort should disappear within 24 hours.

## 2023-11-03 NOTE — Op Note (Signed)
Procedure: Cystoscopy with UroLift implant.  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  Postop diagnosis: Same.  Surgeon: Dr. Bjorn Pippin.  Anesthesia: General.  Specimen: None.  Drains: 18 French Foley catheter.  EBL: None.  Complications: None.  Indications: The patient is a 62 year old male who is elected UroLift for management of his BPH and bladder outlet obstruction.  Procedure: He was taken the operating room was given 2 g of Ancef.  A general anesthetic was induced.  Was placed in lithotomy position and fitted with PAS hose.  His perineum and genitalia were prepped with Betadine solution he was draped in usual sterile fashion.  The UroLift scope and sheath were then passed.  The urethra was normal.  The prostatic urethra was approximately 3 cm in length with primarily lateral lobe hyperplasia with obstruction.  There was a small nonobstructing middle lobe.  Examination of bladder revealed mild trabeculation.  There were no mucosal lesions.  Ureteral orifices were in their normal anatomic position effluxing clear urine.  Once inspection had been performed the first UroLift device was inserted through the sheath and positioned 1.5 cm on the right lateral aspect of the prostate proximal to the bladder neck.  Compression was applied and the device was deployed and then released without difficulty.  Subsequent devices were placed in the left proximal prostatic urethra and the right and left apical prostatic urethra just proximal to the Veru.  The initial left apical implant internal tab was noted to be in the prostatic urethra detached from the suture so this tab was replaced successfully.  Once the implants had been placed the UroLift scope was removed and the 21 French sheath with the 30 degree lens was then passed to allow removal of the loose tab which flushed out without difficulty.  The bladder was then irrigated and inspected along with the prostatic urethra.  There was  some bleeding from the anterior commissure but the prostatic urethra was widely patent with a good anterior channel from the UroLift tabs.  The scope was removed and an 67 French Foley catheter was inserted.  The balloon was filled with 10 mL of sterile fluid.  The catheter was irrigated with clear return and placed to straight drainage.  He was taken down from lithotomy position, his anesthetic was reversed and he was moved to recovery in stable condition.  There were no complications.

## 2023-11-03 NOTE — Transfer of Care (Signed)
Immediate Anesthesia Transfer of Care Note  Patient: Earl Martinez  Procedure(s) Performed: Procedure(s) (LRB): CYSTOSCOPY WITH INSERTION OF UROLIFT (N/A)  Patient Location: PACU  Anesthesia Type: GA  Level of Consciousness: awake, sedated, patient cooperative and responds to stimulation  Airway & Oxygen Therapy: Patient Spontanous Breathing and Patient connected to [place] oxygen  Post-op Assessment: Report given to PACU RN, Post -op Vital signs reviewed and stable and Patient moving all extremities  Post vital signs: Reviewed and stable  Complications: No apparent anesthesia complications

## 2023-11-03 NOTE — Progress Notes (Signed)
Urinary catheter care teaching done, had patient return demonstration on tech to remove catheter tomorrow am per MD instructions. Urine drained from device, of cherry colored urine, after further/ additional PO fluid intake, urine was pinkish in color and clear, one very small blood clot was noted in drainage tube at discharge. Discharge teaching did include catheter care, how to remove device and also teaching done in when it would be important to call MD office, such as heavy bleeding, increased amt of blood clots, inability to void. Pt was able to state back how to remove urinary catheter tomorrow am.

## 2023-11-03 NOTE — Anesthesia Procedure Notes (Signed)
Procedure Name: LMA Insertion Date/Time: 11/03/2023 11:56 AM  Performed by: Jessica Priest, CRNAPre-anesthesia Checklist: Patient identified, Emergency Drugs available, Suction available, Patient being monitored and Timeout performed Patient Re-evaluated:Patient Re-evaluated prior to induction Oxygen Delivery Method: Circle system utilized Preoxygenation: Pre-oxygenation with 100% oxygen Induction Type: IV induction Ventilation: Mask ventilation without difficulty LMA: LMA inserted LMA Size: 5.0 Number of attempts: 1 Airway Equipment and Method: Bite block Placement Confirmation: positive ETCO2, breath sounds checked- equal and bilateral and CO2 detector Tube secured with: Tape Dental Injury: Teeth and Oropharynx as per pre-operative assessment

## 2023-11-03 NOTE — Anesthesia Preprocedure Evaluation (Addendum)
Anesthesia Evaluation  Patient identified by MRN, date of birth, ID band Patient awake    Reviewed: Allergy & Precautions, H&P , NPO status , Patient's Chart, lab work & pertinent test results  Airway Mallampati: II  TM Distance: >3 FB Neck ROM: Full    Dental no notable dental hx. (+) Teeth Intact, Dental Advisory Given, Caps   Pulmonary neg pulmonary ROS   Pulmonary exam normal breath sounds clear to auscultation       Cardiovascular Exercise Tolerance: Good hypertension, negative cardio ROS Normal cardiovascular exam Rhythm:Regular Rate:Normal     Neuro/Psych negative neurological ROS  negative psych ROS   GI/Hepatic negative GI ROS, Neg liver ROS,GERD  Medicated and Controlled,,  Endo/Other  negative endocrine ROS    Renal/GU negative Renal ROS  negative genitourinary   Musculoskeletal negative musculoskeletal ROS (+)    Abdominal   Peds negative pediatric ROS (+)  Hematology negative hematology ROS (+)   Anesthesia Other Findings   Reproductive/Obstetrics negative OB ROS                             Anesthesia Physical Anesthesia Plan  ASA: 2  Anesthesia Plan: General   Post-op Pain Management: Tylenol PO (pre-op)*, Celebrex PO (pre-op)* and Minimal or no pain anticipated   Induction: Intravenous  PONV Risk Score and Plan: 2 and Ondansetron, Dexamethasone and Treatment may vary due to age or medical condition  Airway Management Planned: LMA and Oral ETT  Additional Equipment: None  Intra-op Plan:   Post-operative Plan:   Informed Consent: I have reviewed the patients History and Physical, chart, labs and discussed the procedure including the risks, benefits and alternatives for the proposed anesthesia with the patient or authorized representative who has indicated his/her understanding and acceptance.       Plan Discussed with: CRNA, Surgeon and  Anesthesiologist  Anesthesia Plan Comments: ( )        Anesthesia Quick Evaluation

## 2023-11-03 NOTE — Progress Notes (Signed)
Ambulated to Phase II with minimal assistance, gait very steady, VSS, denies any pain or discomfort, has slight urgency secondary to urinary drainage device in place

## 2023-11-03 NOTE — Interval H&P Note (Signed)
History and Physical Interval Note:  11/03/2023 10:31 AM  Earl Martinez  has presented today for surgery, with the diagnosis of BENIGN PROSTATE HYPERPLASIA WITH BLADDER OUTLET OBSTRUCTION.  The various methods of treatment have been discussed with the patient and family. After consideration of risks, benefits and other options for treatment, the patient has consented to  Procedure(s) with comments: CYSTOSCOPY WITH INSERTION OF UROLIFT (N/A) - 45 MIN SFOR CASE as a surgical intervention.  The patient's history has been reviewed, patient examined, no change in status, stable for surgery.  I have reviewed the patient's chart and labs.  Questions were answered to the patient's satisfaction.     Bjorn Pippin

## 2023-11-04 NOTE — Anesthesia Postprocedure Evaluation (Signed)
Anesthesia Post Note  Patient: Earl Martinez  Procedure(s) Performed: CYSTOSCOPY WITH INSERTION OF UROLIFT (Prostate)     Patient location during evaluation: PACU Anesthesia Type: General Level of consciousness: awake and alert Pain management: pain level controlled Vital Signs Assessment: post-procedure vital signs reviewed and stable Respiratory status: spontaneous breathing, nonlabored ventilation, respiratory function stable and patient connected to nasal cannula oxygen Cardiovascular status: blood pressure returned to baseline and stable Postop Assessment: no apparent nausea or vomiting Anesthetic complications: no  No notable events documented.  Last Vitals:  Vitals:   11/03/23 1315 11/03/23 1503  BP: 118/69 113/68  Pulse: 66 60  Resp: 14 17  Temp:  (!) 36.4 C  SpO2: 99% 99%    Last Pain:  Vitals:   11/03/23 1503  TempSrc: Oral  PainSc: 4                  Randy Whitener

## 2023-11-09 ENCOUNTER — Encounter (HOSPITAL_BASED_OUTPATIENT_CLINIC_OR_DEPARTMENT_OTHER): Payer: Self-pay | Admitting: Urology

## 2024-07-02 ENCOUNTER — Other Ambulatory Visit: Payer: Self-pay | Admitting: Internal Medicine

## 2024-12-11 ENCOUNTER — Other Ambulatory Visit: Payer: Self-pay | Admitting: Internal Medicine
